# Patient Record
Sex: Male | Born: 1960 | Race: White | Hispanic: No | Marital: Married | Smoking: Former smoker
Health system: Southern US, Community
[De-identification: ages and names within clinical notes are randomized; demographics above are authoritative.]

## PROBLEM LIST (undated history)

## (undated) DIAGNOSIS — E785 Hyperlipidemia, unspecified: Secondary | ICD-10-CM

## (undated) DIAGNOSIS — Z789 Other specified health status: Secondary | ICD-10-CM

## (undated) DIAGNOSIS — I10 Essential (primary) hypertension: Secondary | ICD-10-CM

## (undated) DIAGNOSIS — M1990 Unspecified osteoarthritis, unspecified site: Secondary | ICD-10-CM

## (undated) DIAGNOSIS — C801 Malignant (primary) neoplasm, unspecified: Secondary | ICD-10-CM

## (undated) DIAGNOSIS — K219 Gastro-esophageal reflux disease without esophagitis: Secondary | ICD-10-CM

## (undated) DIAGNOSIS — Z973 Presence of spectacles and contact lenses: Secondary | ICD-10-CM

## (undated) HISTORY — PX: COLONOSCOPY: SHX174

## (undated) HISTORY — PX: SHOULDER ARTHROSCOPY: SHX128

---

## 1999-08-12 ENCOUNTER — Ambulatory Visit (HOSPITAL_COMMUNITY): Admission: RE | Admit: 1999-08-12 | Discharge: 1999-08-12 | Payer: Self-pay | Admitting: Orthopedic Surgery

## 1999-08-12 ENCOUNTER — Encounter: Payer: Self-pay | Admitting: Orthopedic Surgery

## 1999-08-26 ENCOUNTER — Ambulatory Visit (HOSPITAL_COMMUNITY): Admission: RE | Admit: 1999-08-26 | Discharge: 1999-08-26 | Payer: Self-pay | Admitting: Orthopedic Surgery

## 1999-08-26 ENCOUNTER — Encounter: Payer: Self-pay | Admitting: Orthopedic Surgery

## 1999-09-09 ENCOUNTER — Encounter: Payer: Self-pay | Admitting: Orthopedic Surgery

## 1999-09-09 ENCOUNTER — Ambulatory Visit (HOSPITAL_COMMUNITY): Admission: RE | Admit: 1999-09-09 | Discharge: 1999-09-09 | Payer: Self-pay | Admitting: Orthopedic Surgery

## 2000-07-13 HISTORY — PX: INGUINAL HERNIA REPAIR: SHX194

## 2000-10-19 ENCOUNTER — Encounter (HOSPITAL_BASED_OUTPATIENT_CLINIC_OR_DEPARTMENT_OTHER): Payer: Self-pay | Admitting: General Surgery

## 2000-10-20 ENCOUNTER — Encounter (INDEPENDENT_AMBULATORY_CARE_PROVIDER_SITE_OTHER): Payer: Self-pay | Admitting: *Deleted

## 2000-10-20 ENCOUNTER — Ambulatory Visit (HOSPITAL_COMMUNITY): Admission: RE | Admit: 2000-10-20 | Discharge: 2000-10-20 | Payer: Self-pay | Admitting: General Surgery

## 2002-05-19 ENCOUNTER — Ambulatory Visit (HOSPITAL_COMMUNITY): Admission: RE | Admit: 2002-05-19 | Discharge: 2002-05-19 | Payer: Self-pay | Admitting: Gastroenterology

## 2002-05-19 ENCOUNTER — Encounter: Payer: Self-pay | Admitting: Gastroenterology

## 2004-07-13 HISTORY — PX: CERVICAL FUSION: SHX112

## 2005-01-15 ENCOUNTER — Ambulatory Visit (HOSPITAL_COMMUNITY): Admission: RE | Admit: 2005-01-15 | Discharge: 2005-01-16 | Payer: Self-pay | Admitting: Orthopaedic Surgery

## 2008-07-13 HISTORY — PX: SPERMATOCELECTOMY: SHX2420

## 2008-07-13 HISTORY — PX: SHOULDER ARTHROSCOPY W/ ROTATOR CUFF REPAIR: SHX2400

## 2008-10-19 ENCOUNTER — Ambulatory Visit (HOSPITAL_BASED_OUTPATIENT_CLINIC_OR_DEPARTMENT_OTHER): Admission: RE | Admit: 2008-10-19 | Discharge: 2008-10-19 | Payer: Self-pay | Admitting: Urology

## 2008-10-19 ENCOUNTER — Encounter (INDEPENDENT_AMBULATORY_CARE_PROVIDER_SITE_OTHER): Payer: Self-pay | Admitting: Urology

## 2010-10-22 LAB — POCT HEMOGLOBIN-HEMACUE: Hemoglobin: 16.2 g/dL (ref 13.0–17.0)

## 2010-11-25 NOTE — Op Note (Signed)
NAME:  John Benton, John Benton NO.:  [id number]   MEDICAL RECORD NO.:  [id number]          PATIENT TYPE:  AMB   LOCATION:  [facility]                         FACILITY:  [facility]   PHYSICIAN:  Mark C. Vernie Ammons, M.D.  DATE OF BIRTH:  29-Jul-1960   DATE OF PROCEDURE:  10/19/2008  DATE OF DISCHARGE:                               OPERATIVE REPORT   PREOPERATIVE DIAGNOSIS:  Right spermatocele.   POSTOPERATIVE DIAGNOSIS:  Right epididymal lesion.   PROCEDURE:  1. Scrotal exploration.  2. Excision of right epididymal lesion.   SURGEON:  Dr. Vernie Ammons.   ANESTHESIA:  General with local supplement.   SPECIMENS:  Right epididymal lesion to pathology.   BLOOD LOSS:  Less than 1 mL.   DRAINS:  None.   COMPLICATIONS:  None.   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  This resorted in progressive worsening of pain in the right  testicular region and swelling in the area the epididymis.  It was felt  to be a spermatocele, and we therefore discussed surgical excision with  the understanding that this may not result in resolution of his pain.  He understands this and has elected to proceed with surgery.   DESCRIPTION OF OPERATION:  After informed consent, the patient was  brought to the major OR, placed on the table and administered general  anesthesia.  His genitalia was then sterilely prepped and draped, and an  official time out was then performed.   A median raphe scrotal incision was then made and carried down over the  right testicle.  The parietal tunica vaginalis was then opened and  drained of several mL of clear amber fluid.  I then opened this larger  and delivered the testicle.  The testicle itself appeared entirely  normal, and then an appendix testis was noted and fulgurated.  I then  turned my attention to the epididymis and noted there was an area of  scarring where it appears the vas had become adherent to the midportion  of the epididymis, and this was associated with a hard nodule.  I  therefore began incising the tissue overlying this and was able to free  up the area surrounding the hard nodule.  I noted the vas was involved  in this, and I isolated the vas and ligated it distally with a 0 silk  suture.  I followed that the vas into the area of the scar and found he  had adherent to the convoluted portion of the vas/epididymis.  I excised  this from the surrounding tissue and vas and fulgurated any bleeding  points.  I ligated the proximal aspect of which appeared to be  convoluted in its anatomy.  Once this was completely excised, it was  sent to pathology.  Further examination of the epididymis revealed no  further abnormality.  I therefore replaced the right testicle in its  normal anatomic position back within the parietal tunica vaginalis and  closed the  parietal tunica vaginalis over the testicle with a running  locking 3-0 Vicryl suture.  I injected 0.50% plain Marcaine in the  subcutaneous tissue of the incision and then closed the incision with a  running 3-0 chromic suture.  Neosporin, a sterile gauze dressing and  then fluffed Kerlix with a scrotal support were then applied, and the  patient was awakened and taken to recovery room in stable and  satisfactory condition.  He tolerated the procedure well, and there were  no intraoperative complications.   He will be given a prescription for Tylox #36 and remain on Keflex 500  mg b.i.d. for 5 days and return to my office in 4 weeks.   My impression is that it appeared this area was mainly scar tissue or  possibly a sperm granuloma, and it most likely occurred from the injury  that occurred at work and not likely from the vasectomy as the vasectomy  would have been well distal to this area on up more much more superiorly  along the cord.  This was located right at the level  of the epididymis,  and I suspect he probably had epididymal trauma with some leakage of  sperm and the formation of a sperm granuloma although the final  pathology will illuminate the exact etiology of this further.      Mark C. Vernie Ammons, M.D.  Electronically Signed     MCO/MEDQ  D:  10/19/2008  T:  10/19/2008  Job:  [id number]

## 2010-11-28 NOTE — Op Note (Signed)
NAME:  John Benton, John Benton NO.:  [id number]   MEDICAL RECORD NO.:  [id number]          PATIENT TYPE:  OIB   LOCATION:  5033                         FACILITY:  [facility]   PHYSICIAN:  Sharolyn Douglas, M.D.        DATE OF BIRTH:  April 30, 1961   DATE OF PROCEDURE:  01/15/2005  DATE OF DISCHARGE:                                 OPERATIVE REPORT   DIAGNOSIS:  Herniated nucleus pulposus, C6-7, with left upper extremity  radiculopathy.   PROCEDURE:  1.  Anterior cervical diskectomy, C6-7.  2.  Anterior cervical arthrodesis, C6-7, and placement of allograft      prosthesis spacer packed with local autogenous bone graft.  3.  Anterior cervical plating, C6-7. using the Abbott spine system.   SURGEON:  Sharolyn Douglas, MD   ASSISTANT:  Verlin Fester, PA   ANESTHESIA:  General endotracheal.   COMPLICATIONS:  None.   ESTIMATED BLOOD LOSS:  Minimal.   INDICATIONS:  The patient is a 50 year old male with severe left upper  extremity radiculopathy thought to be secondary to C6-7 disk herniation with  foraminal extension.  He has failed conservative treatment and has elected  to undergo ACDF at C6-7 in hopes of improving his symptoms.  Risks and  benefits were reviewed including adjacent segment degeneration as well as  the possible need for additional surgery.   PROCEDURE:  The patient was identified in the holding area and taken to the  operating room, underwent general endotracheal anesthesia without  difficulty, given prophylactic IV antibiotics.  Carefully positioned on the  operating room table with Mayfield head rest, neck in slight extension.  Neck was prepped and draped in usual sterile fashion.  a small transverse  skin incision was made on the left side below the cricoid cartilage.  Dissection was carried sharply through the platysma.  The interval between  the SCM and strap muscles medially was developed down to the prevertebral  space.  The esophagus, trachea and carotid sheath  were identified and  protected at all times.  The C6 disk space was identified and a spinal  needle was placed into the C5-6 disk in order to allow intraoperative  imaging, avoiding the patient's shoulders.  The intraoperative x-ray  revealed the needle to be placed at C5-6 and then we worked down, exposing  the C6-7 disk space by elevating the longus coli muscle bilaterally.  Deep  Shadow-Line retractor placed.  Caspar distraction pins placed into the C6  and C7 vertebral bodies.  The microscope was draped and brought into the  field.  Radical diskectomy carried back to the posterior longitudinal  ligament.  The cartilaginous endplates were scraped clean.  A high-speed bur  was used to take down the posterior vertebral margins and complete  foraminotomies bilaterally.  The posterior longitudinal ligament was taken  down from the midline out towards the left foramen.  A moderate-sized disk  herniation was then removed from the left foramen.  Bleeding was controlled.  The foramina were explored with a blunt probe and felt to be patent.  An 8-  mm  allograft prosthesis spacer was then packed with local bone graft  obtained from the drill shavings.  The prosthesis spacer was inserted into  the interspace and countersunk 1 mm.  We then placed a 24-mm anterior  cervical plate with four 13-mm screws.  We had excellent screw purchase and  ensured that the locking mechanism engaged.  The wound was irrigated.  Deep  Penrose drain was left in place.  The esophagus, trachea and carotid sheath  were examined and no apparent injuries.  Platysma was closed with an  interrupted 2-0 Vicryl, subcutaneous layer closed with a running 3-0 Vicryl  followed by a 4-0 subcuticular Vicryl suture on the skin edges, benzoin and  Steri-Strips placed, sterile dressing applied.  The patient was extubated  without difficulty and transferred to Recovery in stable condition, able to  move his upper and lower  extremities.       MC/MEDQ  D:  01/16/2005  T:  01/16/2005  Job:  [id number]

## 2010-11-28 NOTE — Op Note (Signed)
. [facility]  Patient:    John Benton, John Benton                        MRN: [id number] Proc. Date: 10/20/00 Adm. Date:  [id number] Attending:  Sonda Primes                           Operative Report  PREOPERATIVE DIAGNOSIS:  Left inguinal hernia.  POSTOPERATIVE DIAGNOSIS:  Left inguinal hernia.  PROCEDURE:  Left inguinal hernia repair with mesh.  SURGEON:  Mardene Celeste. Lurene Shadow, M.D.  ASSISTANT:  Nurse.  ANESTHESIA:  MAC, 1% Xylocaine with epinephrine.  CLINICAL NOTE:  The patient is a 50 year old man presenting with a left-sided groin bulge which has become recently symptomatic.  He is brought now to the operating room for repair of his left-sided inguinal hernia.  DESCRIPTION OF PROCEDURE:  With the patient positioned supinely and after the induction of satisfactory sedation, the left groin was prepped and draped to be included in the sterile operative field, infiltrating the lower abdominal crease with 1% Xylocaine with epinephrine, making an incision in the lower abdominal crease, taking this through skin and subcutaneous tissue down to the external oblique aponeurosis.  External oblique is opened up through the external inguinal ring with retraction of the ilioinguinal nerve superiorly and medially.  The spermatic cord is elevated and additional injections of 1% Xylocaine used to maintain anesthesia.  Dissecting along the spermatic cord on the anteromedial aspect, a hernia sac is encountered and with intra-abdominal fat comprising one wall of the sac, making this into a sliding hernia.  The fat was dissected free from the wall of the sac and reduced into the peritoneal cavity, and the sac was then closed with a pursestring suture of 2-0 silk.  The floor of the canal was then repaired after the redundant sac was amputated and forwarded for pathologic evaluation.  I used a Prolene mesh suture to the pubic tubercle and carried up with a  running 2-0 Novofil along the conjoined tendon to the internal ring, and again from pubic tubercle along the shelving edge of Pouparts ligament up to the internal ring.  The mesh is split so as to allow for protrusion of the cord though the mesh.  The tails of the mesh were then trimmed and sutured into the internal oblique muscles.  All areas of dissection then checked for hemostasis, spermatic cord returned to its normal anatomic position.  External oblique aponeurosis closed over the cord with a running suture of 2-0 Vicryl, subcutaneous tissues and Scarpas fascia closed with a running 3-0 Vicryl, skin closed with a running 4-0 Monocryl and then reinforced with Steri-Strips.  Sterile dressings applied. Anesthetic reversed.  Patient removed from the operating room to the recovery room in stable condition.  He tolerated the procedure well. DD:  10/20/00 TD:  10/20/00 Job: [id number]

## 2012-01-26 ENCOUNTER — Encounter (HOSPITAL_BASED_OUTPATIENT_CLINIC_OR_DEPARTMENT_OTHER): Payer: Self-pay | Admitting: *Deleted

## 2012-01-26 NOTE — Progress Notes (Signed)
No cardiac or resp problems 

## 2012-02-01 ENCOUNTER — Other Ambulatory Visit: Payer: Self-pay | Admitting: Orthopedic Surgery

## 2012-02-02 ENCOUNTER — Encounter (HOSPITAL_BASED_OUTPATIENT_CLINIC_OR_DEPARTMENT_OTHER): Payer: Self-pay | Admitting: Orthopedic Surgery

## 2012-02-02 ENCOUNTER — Encounter (HOSPITAL_BASED_OUTPATIENT_CLINIC_OR_DEPARTMENT_OTHER): Payer: Self-pay | Admitting: Anesthesiology

## 2012-02-02 ENCOUNTER — Ambulatory Visit (HOSPITAL_BASED_OUTPATIENT_CLINIC_OR_DEPARTMENT_OTHER)
Admission: RE | Admit: 2012-02-02 | Discharge: 2012-02-02 | Disposition: A | Discharge status: Home / Self Care | Payer: BC Managed Care – PPO | Source: Ambulatory Visit | Attending: Orthopedic Surgery | Admitting: Orthopedic Surgery

## 2012-02-02 ENCOUNTER — Encounter (HOSPITAL_BASED_OUTPATIENT_CLINIC_OR_DEPARTMENT_OTHER)
Admission: RE | Disposition: A | Discharge status: Home / Self Care | Payer: Self-pay | Source: Ambulatory Visit | Attending: Orthopedic Surgery

## 2012-02-02 ENCOUNTER — Ambulatory Visit (HOSPITAL_BASED_OUTPATIENT_CLINIC_OR_DEPARTMENT_OTHER): Payer: BC Managed Care – PPO | Admitting: Anesthesiology

## 2012-02-02 DIAGNOSIS — M720 Palmar fascial fibromatosis [Dupuytren]: Secondary | ICD-10-CM | POA: Insufficient documentation

## 2012-02-02 HISTORY — DX: Other specified health status: Z78.9

## 2012-02-02 HISTORY — DX: Hyperlipidemia, unspecified: E78.5

## 2012-02-02 HISTORY — DX: Unspecified osteoarthritis, unspecified site: M19.90

## 2012-02-02 HISTORY — PX: DUPUYTREN CONTRACTURE RELEASE: SHX1478

## 2012-02-02 HISTORY — DX: Gastro-esophageal reflux disease without esophagitis: K21.9

## 2012-02-02 LAB — POCT HEMOGLOBIN-HEMACUE: Hemoglobin: 16.2 g/dL (ref 13.0–17.0)

## 2012-02-02 SURGERY — RELEASE, DUPUYTREN CONTRACTURE
Anesthesia: Choice | Site: Hand | Laterality: Right | Wound class: Clean

## 2012-02-02 MED ORDER — THROMBIN 20000 UNITS EX KIT
PACK | CUTANEOUS | Status: DC | PRN
  Administered 2012-02-02: 20000 [IU] via TOPICAL

## 2012-02-02 MED ORDER — DEXAMETHASONE SODIUM PHOSPHATE 4 MG/ML IJ SOLN
INTRAMUSCULAR | Status: DC | PRN
  Administered 2012-02-02: 4 mg
  Administered 2012-02-02: 10 mg via INTRAVENOUS

## 2012-02-02 MED ORDER — MIDAZOLAM HCL 2 MG/2ML IJ SOLN: 1.0000 mg | INTRAMUSCULAR | Status: DC | PRN

## 2012-02-02 MED ORDER — VANCOMYCIN HCL 1000 MG IV SOLR
1000.0000 mg | INTRAVENOUS | Status: DC | PRN
  Administered 2012-02-02: 1000 mg via INTRAVENOUS

## 2012-02-02 MED ORDER — HYDROCODONE-ACETAMINOPHEN 5-500 MG PO TABS
1.0000 | ORAL_TABLET | ORAL | Status: AC | PRN
Start: 2012-02-02 — End: 2012-02-12

## 2012-02-02 MED ORDER — HYDROCODONE-ACETAMINOPHEN 5-325 MG PO TABS
1.0000 | ORAL_TABLET | Freq: Once | ORAL | Status: AC
  Administered 2012-02-02: 1 via ORAL

## 2012-02-02 MED ORDER — PROPOFOL 10 MG/ML IV BOLUS
INTRAVENOUS | Status: DC | PRN
  Administered 2012-02-02: 230 mg via INTRAVENOUS

## 2012-02-02 MED ORDER — ONDANSETRON HCL 4 MG/2ML IJ SOLN
INTRAMUSCULAR | Status: DC | PRN
  Administered 2012-02-02: 4 mg via INTRAVENOUS

## 2012-02-02 MED ORDER — FENTANYL CITRATE 0.05 MG/ML IJ SOLN
INTRAMUSCULAR | Status: DC | PRN
  Administered 2012-02-02 (×3): 25 ug via INTRAVENOUS

## 2012-02-02 MED ORDER — FENTANYL CITRATE 0.05 MG/ML IJ SOLN: 50.0000 ug | INTRAMUSCULAR | Status: DC | PRN

## 2012-02-02 MED ORDER — LIDOCAINE HCL (CARDIAC) 20 MG/ML IV SOLN
INTRAVENOUS | Status: DC | PRN
  Administered 2012-02-02: 100 mg via INTRAVENOUS

## 2012-02-02 MED ORDER — CHLORHEXIDINE GLUCONATE 4 % EX LIQD: 60.0000 mL | Freq: Once | CUTANEOUS | Status: DC

## 2012-02-02 MED ORDER — BUPIVACAINE HCL (PF) 0.25 % IJ SOLN
INTRAMUSCULAR | Status: DC | PRN
  Administered 2012-02-02: 7 mL

## 2012-02-02 MED ORDER — LORAZEPAM 2 MG/ML IJ SOLN: 1.0000 mg | Freq: Once | INTRAMUSCULAR | Status: DC | PRN

## 2012-02-02 MED ORDER — BUPIVACAINE-EPINEPHRINE PF 0.5-1:200000 % IJ SOLN
INTRAMUSCULAR | Status: DC | PRN
  Administered 2012-02-02: 25 mL

## 2012-02-02 MED ORDER — MIDAZOLAM HCL 2 MG/2ML IJ SOLN
1.0000 mg | INTRAMUSCULAR | Status: DC | PRN
  Administered 2012-02-02 (×2): 2 mg via INTRAVENOUS

## 2012-02-02 MED ORDER — FENTANYL CITRATE 0.05 MG/ML IJ SOLN
50.0000 ug | INTRAMUSCULAR | Status: DC | PRN
  Administered 2012-02-02 (×2): 100 ug via INTRAVENOUS

## 2012-02-02 MED ORDER — LACTATED RINGERS IV SOLN
INTRAVENOUS | Status: DC
  Administered 2012-02-02 (×2): via INTRAVENOUS

## 2012-02-02 MED ORDER — HYDROMORPHONE HCL PF 1 MG/ML IJ SOLN
0.2500 mg | INTRAMUSCULAR | Status: DC | PRN
  Administered 2012-02-02: 0.5 mg via INTRAVENOUS

## 2012-02-02 SURGICAL SUPPLY — 42 items
BANDAGE GAUZE ELAST BULKY 4 IN (GAUZE/BANDAGES/DRESSINGS) ×2 IMPLANT
BLADE MINI RND TIP GREEN BEAV (BLADE) IMPLANT
BLADE SURG 15 STRL LF DISP TIS (BLADE) ×1 IMPLANT
BLADE SURG 15 STRL SS (BLADE) ×1
BNDG COHESIVE 3X5 TAN STRL LF (GAUZE/BANDAGES/DRESSINGS) ×2 IMPLANT
BNDG ESMARK 4X9 LF (GAUZE/BANDAGES/DRESSINGS) ×2 IMPLANT
CHLORAPREP W/TINT 26ML (MISCELLANEOUS) ×2 IMPLANT
CLOTH BEACON ORANGE TIMEOUT ST (SAFETY) ×2 IMPLANT
CORDS BIPOLAR (ELECTRODE) ×2 IMPLANT
COVER MAYO STAND STRL (DRAPES) ×2 IMPLANT
COVER TABLE BACK 60X90 (DRAPES) ×2 IMPLANT
CUFF TOURNIQUET SINGLE 18IN (TOURNIQUET CUFF) IMPLANT
DECANTER SPIKE VIAL GLASS SM (MISCELLANEOUS) IMPLANT
DRAPE EXTREMITY T 121X128X90 (DRAPE) ×2 IMPLANT
DRAPE SURG 17X23 STRL (DRAPES) ×2 IMPLANT
DRSG KUZMA FLUFF (GAUZE/BANDAGES/DRESSINGS) ×2 IMPLANT
GAUZE XEROFORM 1X8 LF (GAUZE/BANDAGES/DRESSINGS) ×2 IMPLANT
GLOVE BIO SURGEON STRL SZ 6.5 (GLOVE) ×2 IMPLANT
GLOVE SURG ORTHO 8.0 STRL STRW (GLOVE) ×2 IMPLANT
GOWN BRE IMP PREV XXLGXLNG (GOWN DISPOSABLE) ×4 IMPLANT
GOWN PREVENTION PLUS XLARGE (GOWN DISPOSABLE) ×2 IMPLANT
LOOP VESSEL MAXI BLUE (MISCELLANEOUS) ×2 IMPLANT
NS IRRIG 1000ML POUR BTL (IV SOLUTION) ×2 IMPLANT
PACK BASIN DAY SURGERY FS (CUSTOM PROCEDURE TRAY) ×2 IMPLANT
PAD CAST 3X4 CTTN HI CHSV (CAST SUPPLIES) ×1 IMPLANT
PADDING CAST ABS 3INX4YD NS (CAST SUPPLIES) ×1
PADDING CAST ABS 4INX4YD NS (CAST SUPPLIES) ×1
PADDING CAST ABS COTTON 3X4 (CAST SUPPLIES) ×1 IMPLANT
PADDING CAST ABS COTTON 4X4 ST (CAST SUPPLIES) ×1 IMPLANT
PADDING CAST COTTON 3X4 STRL (CAST SUPPLIES) ×1
SLEEVE SCD COMPRESS KNEE MED (MISCELLANEOUS) ×2 IMPLANT
SPLINT PLASTER CAST XFAST 3X15 (CAST SUPPLIES) ×10 IMPLANT
SPLINT PLASTER XTRA FASTSET 3X (CAST SUPPLIES) ×10
SPONGE GAUZE 4X4 12PLY (GAUZE/BANDAGES/DRESSINGS) ×2 IMPLANT
STOCKINETTE 4X48 STRL (DRAPES) ×2 IMPLANT
SUT SILK 2 0 FS (SUTURE) ×2 IMPLANT
SUT VICRYL RAPIDE 4/0 PS 2 (SUTURE) ×4 IMPLANT
SYR BULB 3OZ (MISCELLANEOUS) ×2 IMPLANT
SYR CONTROL 10ML LL (SYRINGE) ×2 IMPLANT
TOWEL OR 17X24 6PK STRL BLUE (TOWEL DISPOSABLE) ×2 IMPLANT
UNDERPAD 30X30 INCONTINENT (UNDERPADS AND DIAPERS) ×2 IMPLANT
WATER STERILE IRR 1000ML POUR (IV SOLUTION) IMPLANT

## 2012-02-02 NOTE — Progress Notes (Signed)
Assisted Dr. Kasik with right, ultrasound guided, supraclavicular block. Side rails up, monitors on throughout procedure. See vital signs in flow sheet. Tolerated Procedure well. 

## 2012-02-02 NOTE — Anesthesia Postprocedure Evaluation (Signed)
  Anesthesia Post-op Note  Patient: John Benton  Procedure(s) Performed: Procedure(s) (LRB): DUPUYTREN CONTRACTURE RELEASE (Right)  Patient Location: PACU  Anesthesia Type: GA combined with regional for post-op pain  Level of Consciousness: awake  Airway and Oxygen Therapy: Patient Spontanous Breathing  Post-op Pain: mild  Post-op Assessment: Post-op Vital signs reviewed, Patient's Cardiovascular Status Stable, Respiratory Function Stable, Patent Airway, No signs of Nausea or vomiting and Pain level controlled  Post-op Vital Signs: stable  Complications: No apparent anesthesia complications

## 2012-02-02 NOTE — H&P (Signed)
  John Benton is 66l years old.John Benton is seen for evaluation of his Dupuytren's contracture right middle and little finger. This has pulled the joint down into approximately 30 degrees flexion with 10 degree flexion deformity to the PIP joint. He desires to have this treated surgically.  He has been taking ibuprofen.   Past Medical History: He is allergic to PCN. He is on Simvastatin, Niacin, Omeprazole, fish oil and multivitamins and calcium. He has had C-spine, hernia repair, shoulder surgery times 2.   Family Medical History: Positive for high BP and arthritis.  Social History: He does not smoke. He drinks socially. He is married and a Merchandiser, retail for [employer].  Review of Systems: Positive for cataracts, otherwise negative.He desires to have this treated surgically John Benton is an 51 y.o. male.   Chief Complaint: Dupuytren's contracture rt HPI: see above  Past Medical History  Diagnosis Date  . No pertinent past medical history   . Hyperlipemia   . Arthritis   . GERD (gastroesophageal reflux disease)     Past Surgical History  Procedure Date  . Shoulder arthroscopy w/ rotator cuff repair 2010    left  . Spermatocelectomy 2010  . Inguinal hernia repair 2002    lt  . Cervical fusion 2006  . Colonoscopy     No family history on file. Social History:  reports that he has quit smoking. He quit smokeless tobacco use about 23 years ago. He reports that he drinks alcohol. He reports that he does not use illicit drugs.  Allergies:  Allergies  Allergen Reactions  . Penicillins Anaphylaxis    Medications Prior to Admission  Medication Sig Dispense Refill  . aspirin 81 MG tablet Take 81 mg by mouth daily.      Marland Kitchen co-enzyme Q-10 30 MG capsule Take 30 mg by mouth 3 (three) times daily.      . Multiple Vitamins-Minerals (MULTIVITAMIN WITH MINERALS) tablet Take 1 tablet by mouth daily.      Marland Kitchen omeprazole (PRILOSEC) 20 MG capsule Take 20 mg by mouth daily.      . simvastatin (ZOCOR)  40 MG tablet Take 40 mg by mouth at bedtime.        No results found for this or any previous visit (from the past 48 hour(s)).  No results found.   Pertinent items are noted in HPI.  Height 5\' 10"  (1.778 m), weight 99.791 kg (220 lb).  General appearance: alert Head: Normocephalic, without obvious abnormality Neck: no adenopathy Resp: clear to auscultation bilaterally Cardio: regular rate and rhythm, S1, S2 normal, no murmur, click, rub or gallop GI: soft, non-tender; bowel sounds normal; no masses,  no organomegaly Extremities: extremities normal, atraumatic, no cyanosis or edema Pulses: 2+ and symmetric Skin: Skin color, texture, turgor normal. No rashes or lesions Neurologic: Grossly normal Incision/Wound: na  Assessment/Plan DX: Dupuytrens contracture  We have discussed Xiaflex, fasciotomy, fasciectomy, risks and complications including infection, recurrence, injury to arteries, nerves and incomplete relief of symptoms and dystrophy. These are all discussed. He would like to proceed with fasciectomy of the right little finger and middle finger. This will be scheduled as an outpatient.   Kengo Sturges R 02/02/2012, 7:43 AM

## 2012-02-02 NOTE — Op Note (Signed)
NAME:  John Benton, John Benton NO.:  [id number]  MEDICAL RECORD NO.:  [id number]  LOCATION:                                 FACILITY:  PHYSICIAN:  Cindee Salt, M.D.            DATE OF BIRTH:  DATE OF PROCEDURE:  02/02/2012 DATE OF DISCHARGE:                              OPERATIVE REPORT   PREOPERATIVE DIAGNOSIS:  Dupuytren contracture, right little finger, right ring, right middle.  POSTOPERATIVE DIAGNOSIS:  Dupuytren contracture, right little finger, right ring, right middle.  OPERATION:  Excision of palmar fascia, right little with V-Y advancements, incision of palm, fascia to the middle ring fingers.  SURGEON:  Cindee Salt, MD  ASSISTANT:  Betha Loa, MD  ANESTHESIA:  Axillary block, general, and wrist block.  ANESTHESIOLOGIST:  Bedelia Person, MD  HISTORY:  The patient is a 51 year old male with a history of Dupuytren contracture to the little finger.  He has extensions to the middle and ring.  He is desirous of excision with fasciectomy.  Pre, peri, and postoperative course have been discussed along with risks and complications.  He is aware there is no guarantee with the surgery, possibility of infection, recurrence of injury to arteries, nerves, and tendons, incomplete relief of symptoms, and dystrophy.  In the preoperative area, the patient is seen, the extremity is marked by both the patient and surgeon, and antibiotic is given.  PROCEDURE:  The patient was brought to the operating room where an axillary block was carried out without difficulty by Dr. Gypsy Balsam.  General anesthetic was given.  He was prepped using ChloraPrep, supine position, right arm free.  A 3-minute dry time was allowed.  A time-out was taken confirming the patient and procedure.  A volar zigzag incision Loletha Carrow type was made over the little finger and carried down through subcutaneous tissue.  Bleeders were electrocauterized with bipolar.  The palmar fascia was identified at the proximal  aspect of the flexor retinaculum.  This was released across the entire aspect.  This was then divided identifying neurovascular bundles to the middle ring and little fingers with blunt and sharp dissection.  Palmar fascia was excised taking care to protect neurovascular bundles to the middle phalanx of the little finger.  Spiral nerve was present on the ulnar aspect of the PIP joint area to the little finger.  Both neurovascular bundles were identified and protected.  The extensions to the mid middle and ring fingers were also excised .  These were done to the level of the A2 pulleys on each finger through palm only.  This was done through a separate incision over the metacarpal of the middle finger.  These were then converted to wise.  The wound was copiously irrigated with saline. A thrombin spray was then sprayed into each one of the wounds.  A doubled-over vessel loop drain was placed to the depths of each incision and carried out proximally.  The wound was then closed with interrupted 4-0 Vicryl Rapide sutures.  A compressive dressing was applied.  The tourniquet was deflated.  All fingers immediately pinked.  A dorsal splint was applied. The patient tolerated the procedure  well and was taken to the recovery room for observation in satisfactory condition.          ______________________________ Cindee Salt, M.D.     GK/MEDQ  D:  02/02/2012  T:  02/02/2012  Job:  [id number]

## 2012-02-02 NOTE — Op Note (Signed)
Dictated number: [id number]

## 2012-02-02 NOTE — Anesthesia Preprocedure Evaluation (Signed)
Anesthesia Evaluation  Patient identified by MRN, date of birth, ID band Patient awake    Reviewed: Allergy & Precautions, H&P , NPO status , Patient's Chart, lab work & pertinent test results  Airway Mallampati: II TM Distance: >3 FB Neck ROM: Full    Dental   Pulmonary    Pulmonary exam normal       Cardiovascular     Neuro/Psych    GI/Hepatic GERD-  ,  Endo/Other    Renal/GU      Musculoskeletal   Abdominal (+) + obese,   Peds  Hematology   Anesthesia Other Findings   Reproductive/Obstetrics                           Anesthesia Physical Anesthesia Plan  ASA: II  Anesthesia Plan: General   Post-op Pain Management:    Induction: Intravenous  Airway Management Planned: LMA  Additional Equipment:   Intra-op Plan:   Post-operative Plan: Extubation in OR  Informed Consent: I have reviewed the patients History and Physical, chart, labs and discussed the procedure including the risks, benefits and alternatives for the proposed anesthesia with the patient or authorized representative who has indicated his/her understanding and acceptance.     Plan Discussed with: CRNA and Surgeon  Anesthesia Plan Comments:         Anesthesia Quick Evaluation

## 2012-02-02 NOTE — Anesthesia Procedure Notes (Signed)
Anesthesia Regional Block:  Supraclavicular block  Pre-Anesthetic Checklist: ,, timeout performed, Correct Patient, Correct Site, Correct Laterality, Correct Procedure, Correct Position, site marked, Risks and benefits discussed,  Surgical consent,  Pre-op evaluation,  At surgeon's request and post-op pain management  Laterality: Right  Prep: chloraprep       Needles:  Injection technique: Single-shot  Needle Type: Echogenic Stimulator Needle     Needle Length: 5cm 5 cm Needle Gauge: 22 and 22 G    Additional Needles:  Procedures: ultrasound guided and nerve stimulator Supraclavicular block  Nerve Stimulator or Paresthesia:  Response: 0.5 mA,   Additional Responses:   Narrative:  Start time: 02/02/2012 8:44 AM End time: 02/02/2012 8:58 AM Injection made incrementally with aspirations every 3 mL. Anesthesiologist: Dr Gypsy Balsam  Additional Notes: [id number] POP R [place] block CHG prep, sterile tech US guidance-pix in chart Marc .5% w/epi+decsdron 4mg  infiltrated No air, art blood No compl Stim down to .5ma Dr Gypsy Balsam

## 2012-02-02 NOTE — Brief Op Note (Signed)
02/02/2012  10:48 AM  PATIENT:  Scarlette Ar  51 y.o. male  PRE-OPERATIVE DIAGNOSIS:  dupuytrens right little and right middle fingers and right ring finger  POST-OPERATIVE DIAGNOSIS:  dupuytrens right little and right middle fingers and right ring finger  PROCEDURE:  Procedure(s) (LRB): DUPUYTREN CONTRACTURE RELEASE (Right)  SURGEON:  Surgeon(s) and Role:    * Nicki Reaper, MD - Primary    * Tami Ribas, MD - Assisting  PHYSICIAN ASSISTANT:   ASSISTANTS: K Sumire Halbleib,MD   ANESTHESIA:   local, regional and general  EBL:  Total I/O In: 300 [I.V.:300] Out: -   BLOOD ADMINISTERED:none  DRAINS: Penrose drain in the palm and little   LOCAL MEDICATIONS USED:  MARCAINE     SPECIMEN:  Excision  DISPOSITION OF SPECIMEN:  PATHOLOGY  COUNTS:  YES  TOURNIQUET:   Total Tourniquet Time Documented: Upper Arm (Right) - 55 minutes  DICTATION: .Other Dictation: Dictation Number [id number]  PLAN OF CARE: Discharge to home after PACU  PATIENT DISPOSITION:  PACU - hemodynamically stable.

## 2012-02-02 NOTE — Transfer of Care (Signed)
Immediate Anesthesia Transfer of Care Note  Patient: John Benton  Procedure(s) Performed: Procedure(s) (LRB): DUPUYTREN CONTRACTURE RELEASE (Right)  Patient Location: PACU  Anesthesia Type: GA combined with regional for post-op pain  Level of Consciousness: awake and patient cooperative  Airway & Oxygen Therapy: Patient Spontanous Breathing and Patient connected to face mask oxygen  Post-op Assessment: Report given to PACU RN  Post vital signs: Reviewed and stable  Complications: No apparent anesthesia complications

## 2012-02-03 ENCOUNTER — Encounter (HOSPITAL_BASED_OUTPATIENT_CLINIC_OR_DEPARTMENT_OTHER): Payer: Self-pay | Admitting: Orthopedic Surgery

## 2013-04-12 ENCOUNTER — Other Ambulatory Visit: Payer: Self-pay | Admitting: Orthopedic Surgery

## 2013-04-25 ENCOUNTER — Encounter (INDEPENDENT_AMBULATORY_CARE_PROVIDER_SITE_OTHER): Payer: Self-pay | Admitting: General Surgery

## 2013-04-25 ENCOUNTER — Ambulatory Visit (INDEPENDENT_AMBULATORY_CARE_PROVIDER_SITE_OTHER): Payer: BC Managed Care – PPO | Admitting: General Surgery

## 2013-04-25 VITALS — BP 114/90 | HR 53 | Ht 70.5 in | Wt 223.4 lb

## 2013-04-25 DIAGNOSIS — K429 Umbilical hernia without obstruction or gangrene: Secondary | ICD-10-CM

## 2013-04-25 NOTE — Patient Instructions (Signed)
Plan for umbilical hernia repair with possible mesh

## 2013-04-25 NOTE — Progress Notes (Signed)
Patient ID: John Benton, male   DOB: 11/26/1960, 52 y.o.   MRN: [id number]  Chief Complaint  Patient presents with  . New Evaluation    eval umb hernia    HPI John Benton is a 52 y.o. male.  We are asked to see the patient in consultation by Dr. Foy Guadalajara to evaluate him for an umbilical hernia. The patient is a 52 year old white male who was recently lifting heavy weights at the gym when he felt some discomfort at his bellybutton. Shortly after this he noticed a small bulge at his bellybutton. He is able to reduce it. He does have some discomfort associated with it. He denies any nausea or vomiting. His appetite is good and his bowels are working normally.  HPI  Past Medical History  Diagnosis Date  . No pertinent past medical history   . Hyperlipemia   . Arthritis   . GERD (gastroesophageal reflux disease)     Past Surgical History  Procedure Laterality Date  . Shoulder arthroscopy w/ rotator cuff repair  2010    left  . Spermatocelectomy  2010  . Inguinal hernia repair  2002    lt  . Cervical fusion  2006  . Colonoscopy    . Dupuytren contracture release  02/02/2012    Procedure: DUPUYTREN CONTRACTURE RELEASE;  Surgeon: Nicki Reaper, MD;  Location: [facility];  Service: Orthopedics;  Laterality: Right;  fasciectomy right little, middle, and ring fingers of right hand.    Family History  Problem Relation Age of Onset  . Kidney disease Mother   . Cancer Sister     Breast  . Cancer Maternal Grandmother     Breast    Social History History  Substance Use Topics  . Smoking status: Former Games developer  . Smokeless tobacco: Former Neurosurgeon    Quit date: 01/25/1989  . Alcohol Use: Yes     Comment: daily    Allergies  Allergen Reactions  . Penicillins Anaphylaxis    Current Outpatient Prescriptions  Medication Sig Dispense Refill  . calcium carbonate 200 MG capsule Take 250 mg by mouth 2 (two) times daily with a meal.      . co-enzyme Q-10 30 MG capsule Take 30  mg by mouth 3 (three) times daily.      . fish oil-omega-3 fatty acids 1000 MG capsule Take 2 g by mouth daily.      . Multiple Vitamins-Minerals (MULTIVITAMIN WITH MINERALS) tablet Take 1 tablet by mouth daily.      Marland Kitchen omeprazole (PRILOSEC) 20 MG capsule Take 20 mg by mouth daily.      . simvastatin (ZOCOR) 40 MG tablet Take 40 mg by mouth at bedtime.       No current facility-administered medications for this visit.    Review of Systems Review of Systems  Constitutional: Negative.   HENT: Negative.   Eyes: Negative.   Respiratory: Negative.   Cardiovascular: Negative.   Gastrointestinal: Positive for abdominal pain.  Endocrine: Negative.   Genitourinary: Negative.   Musculoskeletal: Negative.   Skin: Negative.   Allergic/Immunologic: Negative.   Neurological: Negative.   Hematological: Negative.   Psychiatric/Behavioral: Negative.     Blood pressure 114/90, pulse 53, height 5' 10.5" (1.791 m), weight 223 lb 6.4 oz (101.334 kg), SpO2 98.00%.  Physical Exam Physical Exam  Constitutional: He is oriented to person, place, and time. He appears well-developed and well-nourished.  HENT:  Head: Normocephalic and atraumatic.  Eyes: Conjunctivae and EOM are  normal. Pupils are equal, round, and reactive to light.  Neck: Normal range of motion. Neck supple.  Cardiovascular: Normal rate, regular rhythm and normal heart sounds.   Pulmonary/Chest: Effort normal and breath sounds normal.  Abdominal: Soft. Bowel sounds are normal.  There is a small reducible umbilical hernia. There is some tenderness to palpation.  Musculoskeletal: Normal range of motion.  Neurological: He is alert and oriented to person, place, and time.  Skin: Skin is warm and dry.  Psychiatric: He has a normal mood and affect. His behavior is normal.    Data Reviewed As above  Assessment    The patient has a small but symptomatic umbilical hernia. Because of the risk of incarceration strenuousness think he would  benefit from having this fixed. He would also like to have this done. I have discussed with him in detail the risks and benefits of the operation to fix the hernia as well as some of the technical aspects including the use of mesh and he understands and wishes to proceed     Plan    Plan for umbilical hernia repair with mesh. We will need to coordinate with Dr. Merlyn Lot Since he is also scheduled for hand surgery that day and would like to try to do this with one anesthetic.       TOTH III,PAUL S 04/25/2013, 12:20 PM

## 2013-04-28 ENCOUNTER — Encounter (INDEPENDENT_AMBULATORY_CARE_PROVIDER_SITE_OTHER): Payer: Self-pay | Admitting: General Surgery

## 2013-05-11 ENCOUNTER — Encounter (HOSPITAL_BASED_OUTPATIENT_CLINIC_OR_DEPARTMENT_OTHER): Payer: Self-pay | Admitting: *Deleted

## 2013-05-11 NOTE — Progress Notes (Signed)
Pt had rt hand surgery here 2013-he has no cardiac or resp problems Had labs pcp recently

## 2013-05-17 ENCOUNTER — Encounter (HOSPITAL_BASED_OUTPATIENT_CLINIC_OR_DEPARTMENT_OTHER)
Admission: RE | Disposition: A | Discharge status: Home / Self Care | Payer: Self-pay | Source: Ambulatory Visit | Attending: Orthopedic Surgery

## 2013-05-17 ENCOUNTER — Ambulatory Visit (HOSPITAL_BASED_OUTPATIENT_CLINIC_OR_DEPARTMENT_OTHER): Payer: BC Managed Care – PPO | Admitting: Anesthesiology

## 2013-05-17 ENCOUNTER — Encounter (HOSPITAL_BASED_OUTPATIENT_CLINIC_OR_DEPARTMENT_OTHER): Payer: BC Managed Care – PPO | Admitting: Anesthesiology

## 2013-05-17 ENCOUNTER — Ambulatory Visit (HOSPITAL_BASED_OUTPATIENT_CLINIC_OR_DEPARTMENT_OTHER)
Admission: RE | Admit: 2013-05-17 | Discharge: 2013-05-17 | Disposition: A | Discharge status: Home / Self Care | Payer: BC Managed Care – PPO | Source: Ambulatory Visit | Attending: Orthopedic Surgery | Admitting: Orthopedic Surgery

## 2013-05-17 ENCOUNTER — Encounter (HOSPITAL_BASED_OUTPATIENT_CLINIC_OR_DEPARTMENT_OTHER): Payer: Self-pay | Admitting: *Deleted

## 2013-05-17 DIAGNOSIS — Z87891 Personal history of nicotine dependence: Secondary | ICD-10-CM | POA: Insufficient documentation

## 2013-05-17 DIAGNOSIS — M720 Palmar fascial fibromatosis [Dupuytren]: Secondary | ICD-10-CM | POA: Insufficient documentation

## 2013-05-17 DIAGNOSIS — K429 Umbilical hernia without obstruction or gangrene: Secondary | ICD-10-CM | POA: Insufficient documentation

## 2013-05-17 DIAGNOSIS — R030 Elevated blood-pressure reading, without diagnosis of hypertension: Secondary | ICD-10-CM | POA: Insufficient documentation

## 2013-05-17 DIAGNOSIS — E785 Hyperlipidemia, unspecified: Secondary | ICD-10-CM | POA: Insufficient documentation

## 2013-05-17 DIAGNOSIS — K219 Gastro-esophageal reflux disease without esophagitis: Secondary | ICD-10-CM | POA: Insufficient documentation

## 2013-05-17 DIAGNOSIS — Z880 Allergy status to penicillin: Secondary | ICD-10-CM | POA: Insufficient documentation

## 2013-05-17 HISTORY — DX: Presence of spectacles and contact lenses: Z97.3

## 2013-05-17 HISTORY — PX: FASCIOTOMY: SHX132

## 2013-05-17 HISTORY — PX: UMBILICAL HERNIA REPAIR: SHX196

## 2013-05-17 SURGERY — FASCIOTOMY, UPPER EXTREMITY
Anesthesia: General | Site: Hand | Wound class: Clean

## 2013-05-17 MED ORDER — FENTANYL CITRATE 0.05 MG/ML IJ SOLN
INTRAMUSCULAR | Status: AC
  Filled 2013-05-17: qty 6

## 2013-05-17 MED ORDER — HYDROMORPHONE HCL PF 1 MG/ML IJ SOLN
INTRAMUSCULAR | Status: AC
  Filled 2013-05-17: qty 1

## 2013-05-17 MED ORDER — BUPIVACAINE HCL (PF) 0.25 % IJ SOLN
INTRAMUSCULAR | Status: AC
  Filled 2013-05-17: qty 30

## 2013-05-17 MED ORDER — THROMBIN 20000 UNITS EX KIT
PACK | CUTANEOUS | Status: DC | PRN
  Administered 2013-05-17: 4000 [IU] via TOPICAL

## 2013-05-17 MED ORDER — EPHEDRINE SULFATE 50 MG/ML IJ SOLN
INTRAMUSCULAR | Status: DC | PRN
  Administered 2013-05-17: 10 mg via INTRAVENOUS

## 2013-05-17 MED ORDER — CHLORHEXIDINE GLUCONATE 4 % EX LIQD: 1.0000 "application " | Freq: Once | CUTANEOUS | Status: DC

## 2013-05-17 MED ORDER — MIDAZOLAM HCL 2 MG/2ML IJ SOLN: 1.0000 mg | INTRAMUSCULAR | Status: DC | PRN

## 2013-05-17 MED ORDER — HYDROMORPHONE HCL PF 1 MG/ML IJ SOLN
0.2500 mg | INTRAMUSCULAR | Status: DC | PRN
  Administered 2013-05-17: 0.5 mg via INTRAVENOUS

## 2013-05-17 MED ORDER — BUPIVACAINE HCL (PF) 0.25 % IJ SOLN
INTRAMUSCULAR | Status: DC | PRN
  Administered 2013-05-17: 10 mL
  Administered 2013-05-17: 8 mL

## 2013-05-17 MED ORDER — FENTANYL CITRATE 0.05 MG/ML IJ SOLN: 50.0000 ug | INTRAMUSCULAR | Status: DC | PRN

## 2013-05-17 MED ORDER — BUPIVACAINE-EPINEPHRINE PF 0.25-1:200000 % IJ SOLN
INTRAMUSCULAR | Status: AC
  Filled 2013-05-17: qty 30

## 2013-05-17 MED ORDER — CHLORHEXIDINE GLUCONATE 4 % EX LIQD: 60.0000 mL | Freq: Once | CUTANEOUS | Status: DC

## 2013-05-17 MED ORDER — OXYCODONE-ACETAMINOPHEN 10-325 MG PO TABS: 1.0000 | ORAL_TABLET | ORAL | Status: DC | PRN

## 2013-05-17 MED ORDER — VANCOMYCIN HCL IN DEXTROSE 1-5 GM/200ML-% IV SOLN: 1000.0000 mg | INTRAVENOUS | Status: DC

## 2013-05-17 MED ORDER — KETOROLAC TROMETHAMINE 30 MG/ML IJ SOLN
30.0000 mg | Freq: Once | INTRAMUSCULAR | Status: AC
  Administered 2013-05-17: 30 mg via INTRAVENOUS

## 2013-05-17 MED ORDER — LIDOCAINE HCL (CARDIAC) 20 MG/ML IV SOLN
INTRAVENOUS | Status: DC | PRN
  Administered 2013-05-17: 80 mg via INTRAVENOUS

## 2013-05-17 MED ORDER — MIDAZOLAM HCL 2 MG/2ML IJ SOLN
INTRAMUSCULAR | Status: AC
  Filled 2013-05-17: qty 2

## 2013-05-17 MED ORDER — SUCCINYLCHOLINE CHLORIDE 20 MG/ML IJ SOLN
INTRAMUSCULAR | Status: AC
  Filled 2013-05-17: qty 1

## 2013-05-17 MED ORDER — ONDANSETRON HCL 4 MG/2ML IJ SOLN: 4.0000 mg | Freq: Once | INTRAMUSCULAR | Status: DC | PRN

## 2013-05-17 MED ORDER — VANCOMYCIN HCL IN DEXTROSE 1-5 GM/200ML-% IV SOLN
INTRAVENOUS | Status: AC
  Filled 2013-05-17: qty 200

## 2013-05-17 MED ORDER — DEXAMETHASONE SODIUM PHOSPHATE 4 MG/ML IJ SOLN
INTRAMUSCULAR | Status: DC | PRN
  Administered 2013-05-17: 10 mg via INTRAVENOUS

## 2013-05-17 MED ORDER — OXYCODONE HCL 5 MG PO TABS: 5.0000 mg | ORAL_TABLET | Freq: Once | ORAL | Status: DC | PRN

## 2013-05-17 MED ORDER — THROMBIN 20000 UNITS EX KIT
PACK | CUTANEOUS | Status: AC
  Filled 2013-05-17: qty 1

## 2013-05-17 MED ORDER — HYDROMORPHONE HCL PF 1 MG/ML IJ SOLN
0.2500 mg | INTRAMUSCULAR | Status: DC | PRN
  Administered 2013-05-17 (×4): 0.5 mg via INTRAVENOUS

## 2013-05-17 MED ORDER — PROPOFOL 10 MG/ML IV BOLUS
INTRAVENOUS | Status: AC
  Filled 2013-05-17: qty 20

## 2013-05-17 MED ORDER — PROPOFOL 10 MG/ML IV BOLUS
INTRAVENOUS | Status: DC | PRN
  Administered 2013-05-17: 300 mg via INTRAVENOUS

## 2013-05-17 MED ORDER — LACTATED RINGERS IV SOLN
INTRAVENOUS | Status: DC
  Administered 2013-05-17 (×3): via INTRAVENOUS

## 2013-05-17 MED ORDER — VANCOMYCIN HCL IN DEXTROSE 1-5 GM/200ML-% IV SOLN
1000.0000 mg | INTRAVENOUS | Status: AC
  Administered 2013-05-17: 1000 mg via INTRAVENOUS

## 2013-05-17 MED ORDER — FENTANYL CITRATE 0.05 MG/ML IJ SOLN
INTRAMUSCULAR | Status: DC | PRN
  Administered 2013-05-17 (×2): 25 ug via INTRAVENOUS
  Administered 2013-05-17: 100 ug via INTRAVENOUS
  Administered 2013-05-17: 50 ug via INTRAVENOUS

## 2013-05-17 MED ORDER — MIDAZOLAM HCL 5 MG/5ML IJ SOLN
INTRAMUSCULAR | Status: DC | PRN
  Administered 2013-05-17: 2 mg via INTRAVENOUS

## 2013-05-17 MED ORDER — OXYCODONE HCL 5 MG/5ML PO SOLN: 5.0000 mg | Freq: Once | ORAL | Status: DC | PRN

## 2013-05-17 SURGICAL SUPPLY — 71 items
BANDAGE GAUZE ELAST BULKY 4 IN (GAUZE/BANDAGES/DRESSINGS) ×4 IMPLANT
BENZOIN TINCTURE PRP APPL 2/3 (GAUZE/BANDAGES/DRESSINGS) IMPLANT
BLADE MINI RND TIP GREEN BEAV (BLADE) ×4 IMPLANT
BLADE SURG 15 STRL LF DISP TIS (BLADE) ×6 IMPLANT
BLADE SURG 15 STRL SS (BLADE) ×2
BLADE SURG ROTATE 9660 (MISCELLANEOUS) ×4 IMPLANT
BNDG COHESIVE 3X5 TAN STRL LF (GAUZE/BANDAGES/DRESSINGS) ×4 IMPLANT
BNDG ESMARK 4X9 LF (GAUZE/BANDAGES/DRESSINGS) ×4 IMPLANT
CHLORAPREP W/TINT 26ML (MISCELLANEOUS) ×8 IMPLANT
CORDS BIPOLAR (ELECTRODE) ×4 IMPLANT
COVER MAYO STAND STRL (DRAPES) ×8 IMPLANT
COVER TABLE BACK 60X90 (DRAPES) ×4 IMPLANT
CUFF TOURNIQUET SINGLE 18IN (TOURNIQUET CUFF) ×4 IMPLANT
DECANTER SPIKE VIAL GLASS SM (MISCELLANEOUS) ×4 IMPLANT
DERMABOND ADVANCED (GAUZE/BANDAGES/DRESSINGS) ×1
DERMABOND ADVANCED .7 DNX12 (GAUZE/BANDAGES/DRESSINGS) ×3 IMPLANT
DRAPE EXTREMITY T 121X128X90 (DRAPE) ×4 IMPLANT
DRAPE LAPAROTOMY TRNSV 102X78 (DRAPE) ×4 IMPLANT
DRAPE SURG 17X23 STRL (DRAPES) ×4 IMPLANT
DRAPE UTILITY XL STRL (DRAPES) ×4 IMPLANT
DRSG KUZMA FLUFF (GAUZE/BANDAGES/DRESSINGS) IMPLANT
DRSG TEGADERM 4X4.75 (GAUZE/BANDAGES/DRESSINGS) IMPLANT
ELECT COATED BLADE 2.86 ST (ELECTRODE) ×4 IMPLANT
ELECT REM PT RETURN 9FT ADLT (ELECTROSURGICAL) ×4
ELECTRODE REM PT RTRN 9FT ADLT (ELECTROSURGICAL) ×3 IMPLANT
GAUZE SPONGE 4X4 12PLY STRL LF (GAUZE/BANDAGES/DRESSINGS) IMPLANT
GAUZE XEROFORM 1X8 LF (GAUZE/BANDAGES/DRESSINGS) ×4 IMPLANT
GLOVE BIO SURGEON STRL SZ 6.5 (GLOVE) IMPLANT
GLOVE BIO SURGEON STRL SZ7.5 (GLOVE) ×4 IMPLANT
GLOVE BIOGEL PI IND STRL 7.0 (GLOVE) ×9 IMPLANT
GLOVE BIOGEL PI INDICATOR 7.0 (GLOVE) ×3
GLOVE ECLIPSE 6.5 STRL STRAW (GLOVE) ×8 IMPLANT
GLOVE SURG ORTHO 8.0 STRL STRW (GLOVE) ×8 IMPLANT
GOWN BRE IMP PREV XXLGXLNG (GOWN DISPOSABLE) ×4 IMPLANT
GOWN PREVENTION PLUS XLARGE (GOWN DISPOSABLE) ×12 IMPLANT
LOOP VESSEL MAXI BLUE (MISCELLANEOUS) ×4 IMPLANT
NEEDLE HYPO 22GX1.5 SAFETY (NEEDLE) ×4 IMPLANT
NEEDLE HYPO 25X1 1.5 SAFETY (NEEDLE) ×4 IMPLANT
NS IRRIG 1000ML POUR BTL (IV SOLUTION) ×4 IMPLANT
PACK BASIN DAY SURGERY FS (CUSTOM PROCEDURE TRAY) ×4 IMPLANT
PAD CAST 3X4 CTTN HI CHSV (CAST SUPPLIES) ×3 IMPLANT
PADDING CAST ABS 3INX4YD NS (CAST SUPPLIES)
PADDING CAST ABS 4INX4YD NS (CAST SUPPLIES) ×1
PADDING CAST ABS COTTON 3X4 (CAST SUPPLIES) IMPLANT
PADDING CAST ABS COTTON 4X4 ST (CAST SUPPLIES) ×3 IMPLANT
PADDING CAST COTTON 3X4 STRL (CAST SUPPLIES) ×1
PENCIL BUTTON HOLSTER BLD 10FT (ELECTRODE) ×4 IMPLANT
SLEEVE SCD COMPRESS KNEE MED (MISCELLANEOUS) ×4 IMPLANT
SPLINT PLASTER CAST XFAST 3X15 (CAST SUPPLIES) ×30 IMPLANT
SPLINT PLASTER XTRA FASTSET 3X (CAST SUPPLIES) ×10
SPONGE GAUZE 4X4 12PLY (GAUZE/BANDAGES/DRESSINGS) ×4 IMPLANT
SPONGE LAP 18X18 X RAY DECT (DISPOSABLE) ×4 IMPLANT
STOCKINETTE 4X48 STRL (DRAPES) ×4 IMPLANT
STRIP CLOSURE SKIN 1/2X4 (GAUZE/BANDAGES/DRESSINGS) IMPLANT
SUT MON AB 4-0 PC3 18 (SUTURE) ×4 IMPLANT
SUT NOVA NAB DX-16 0-1 5-0 T12 (SUTURE) ×4 IMPLANT
SUT SILK 2 0 FS (SUTURE) ×4 IMPLANT
SUT SILK 3 0 SH 30 (SUTURE) IMPLANT
SUT VIC AB 2-0 SH 27 (SUTURE) ×1
SUT VIC AB 2-0 SH 27XBRD (SUTURE) ×3 IMPLANT
SUT VIC AB 3-0 54X BRD REEL (SUTURE) IMPLANT
SUT VIC AB 3-0 BRD 54 (SUTURE)
SUT VIC AB 3-0 SH 27 (SUTURE) ×1
SUT VIC AB 3-0 SH 27X BRD (SUTURE) ×3 IMPLANT
SUT VICRYL RAPID 5 0 P 3 (SUTURE) IMPLANT
SUT VICRYL RAPIDE 4/0 PS 2 (SUTURE) ×4 IMPLANT
SYR BULB 3OZ (MISCELLANEOUS) ×4 IMPLANT
SYR CONTROL 10ML LL (SYRINGE) ×4 IMPLANT
TOWEL OR 17X24 6PK STRL BLUE (TOWEL DISPOSABLE) ×16 IMPLANT
TOWEL OR NON WOVEN STRL DISP B (DISPOSABLE) ×4 IMPLANT
UNDERPAD 30X30 INCONTINENT (UNDERPADS AND DIAPERS) ×4 IMPLANT

## 2013-05-17 NOTE — Op Note (Signed)
05/17/2013  2:11 PM  PATIENT:  John Benton  52 y.o. male  PRE-OPERATIVE DIAGNOSIS:   UMBILICAL HERNIA  POST-OPERATIVE DIAGNOSIS:  UMBILICAL HERNIA  PROCEDURE:  Procedure(s): HERNIA REPAIR UMBILICAL ADULT (N/A)  SURGEON:  Surgeon(s) and Role:     * Robyne Askew, MD - Primary  PHYSICIAN ASSISTANT:   ASSISTANTS: none   ANESTHESIA:   general  EBL:  Total I/O In: 1300 [I.V.:1300] Out: -   BLOOD ADMINISTERED:none  DRAINS: none   LOCAL MEDICATIONS USED:  MARCAINE     SPECIMEN:  No Specimen  DISPOSITION OF SPECIMEN:  N/A  COUNTS:  YES  DICTATION: .Dragon Dictation After informed consent was obtained the patient was brought to the operating room and placed in the supine position on the operating room table. After adequate induction of general anesthesia the patient's abdomen was prepped with ChloraPrep, wet-to-dry, and draped in usual sterile manner. The area around the umbilicus was infiltrated with quarter percent Marcaine. A small transversely oriented incision was made at the inferior edge of the umbilicus with a 15 blade knife. This incision was carried through the skin and subcutaneous change tissue sharply with electrocautery. The hernia defect was identified. The hernia was separated from the skin sharply with the electrocautery. The hernia was then able to be easily reduced. The peritoneum was not violated. The actual fascial defect was only a few millimeters across. The fascial defect was closed with interrupted #1 Novafil stitches. The wound was then irrigated with copious amounts of saline. The deep layer the wound was closed with interrupted 2-0 Vicryl stitches. The skin was then closed with interrupted 4-0 Monocryl subcuticular stitches. Dermabond dressings were applied. The patient tolerated the procedure well. At the end of the case the needle sponge and instrument counts were correct. The patient was then awakened and taken to recovery in stable condition.  PLAN  OF CARE: Discharge to home after PACU  PATIENT DISPOSITION:  PACU - hemodynamically stable.   Delay start of Pharmacological VTE agent (>24hrs) due to surgical blood loss or risk of bleeding: not applicable

## 2013-05-17 NOTE — Interval H&P Note (Signed)
History and Physical Interval Note:  05/17/2013 12:40 PM  John Benton  has presented today for surgery, with the diagnosis of DUPUYTRENS LEFT SMALL RING MIDDLE  The various methods of treatment have been discussed with the patient and family. After consideration of risks, benefits and other options for treatment, the patient has consented to  Procedure(s): FASCIECTOMY LEFT SMALL, RING, MIDDLE (Left) HERNIA REPAIR UMBILICAL ADULT (N/A) INSERTION OF MESH (N/A) as a surgical intervention .  The patient's history has been reviewed, patient examined, no change in status, stable for surgery.  I have reviewed the patient's chart and labs.  Questions were answered to the patient's satisfaction.     TOTH III,PAUL S

## 2013-05-17 NOTE — Anesthesia Preprocedure Evaluation (Signed)
Anesthesia Evaluation  Patient identified by MRN, date of birth, ID band Patient awake    Reviewed: Allergy & Precautions, H&P , NPO status , Patient's Chart, lab work & pertinent test results  Airway Mallampati: I TM Distance: >3 FB Neck ROM: Full    Dental  (+) Teeth Intact and Dental Advisory Given   Pulmonary  breath sounds clear to auscultation        Cardiovascular Rhythm:Regular Rate:Normal     Neuro/Psych    GI/Hepatic GERD-  Medicated and Controlled,  Endo/Other    Renal/GU      Musculoskeletal   Abdominal   Peds  Hematology   Anesthesia Other Findings   Reproductive/Obstetrics                           Anesthesia Physical Anesthesia Plan  ASA: I  Anesthesia Plan: General   Post-op Pain Management:    Induction: Intravenous  Airway Management Planned: LMA  Additional Equipment:   Intra-op Plan:   Post-operative Plan: Extubation in OR  Informed Consent: I have reviewed the patients History and Physical, chart, labs and discussed the procedure including the risks, benefits and alternatives for the proposed anesthesia with the patient or authorized representative who has indicated his/her understanding and acceptance.   Dental advisory given  Plan Discussed with: CRNA, Anesthesiologist and Surgeon  Anesthesia Plan Comments:         Anesthesia Quick Evaluation

## 2013-05-17 NOTE — Transfer of Care (Signed)
Immediate Anesthesia Transfer of Care Note  Patient: John Benton  Procedure(s) Performed: Procedure(s): FASCIECTOMY LEFT SMALL, RING, MIDDLE FINGERS (Left) HERNIA REPAIR UMBILICAL ADULT (N/A)  Patient Location: PACU  Anesthesia Type:General  Level of Consciousness: awake, alert  and oriented  Airway & Oxygen Therapy: Patient Spontanous Breathing and Patient connected to face mask oxygen  Post-op Assessment: Report given to PACU RN and Post -op Vital signs reviewed and stable  Post vital signs: Reviewed and stable  Complications: No apparent anesthesia complications

## 2013-05-17 NOTE — Brief Op Note (Signed)
05/17/2013  1:44 PM  PATIENT:  John Benton  52 y.o. male  PRE-OPERATIVE DIAGNOSIS:  DUPUYTRENS LEFT SMALL, RING, MIDDLE FINGERS AND UMBILICAL HERNIA  POST-OPERATIVE DIAGNOSIS:  DUPUYTRENS LEFT SMALL, RING, MIDDLE FINGERS AND UMBILICAL HERNIA  PROCEDURE:  Procedure(s): FASCIECTOMY LEFT SMALL, RING, MIDDLE FINGERS (Left) HERNIA REPAIR UMBILICAL ADULT (N/A) INSERTION OF MESH (N/A)  SURGEON:  Surgeon(s) and Role: Panel 1:    * Nicki Reaper, MD - Primary  Panel 2:    * Robyne Askew, MD - Primary  PHYSICIAN ASSISTANT:   ASSISTANTS: none   ANESTHESIA:   local and general  EBL:  Total I/O In: 1000 [I.V.:1000] Out: -   BLOOD ADMINISTERED:none  DRAINS: none   LOCAL MEDICATIONS USED:  MARCAINE     SPECIMEN:  Excision  DISPOSITION OF SPECIMEN:  PATHOLOGY  COUNTS:  YES  TOURNIQUET:   Total Tourniquet Time Documented: Upper Arm (Left) - 44 minutes Total: Upper Arm (Left) - 44 minutes   DICTATION: .Other Dictation: Dictation Number [id number]  PLAN OF CARE: Discharge to home after PACU  PATIENT DISPOSITION:  PACU - hemodynamically stable.

## 2013-05-17 NOTE — H&P (Signed)
John Benton is a 52 year old right hand dominant male s/p Dupuytren's excision with a fasciectomy of his right middle, ring and little fingers. He states that his left little finger has now begun to progress. He states this has significantly changed over the past 3 months. He has no new history of injury.  PAST MEDICAL HISTORY: He has an allergy to PCN. He is on Omeprazole, Simvastatin, calcium, multivitamins and fish oil. He has had both shoulders, C-spine fusion, hernia and Dupuytren's excision.   FAMILY H ISTORY: Positive for high BP and arthritis.  SOCIAL HISTORY: He does not smoke. He drinks socially. 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   REVIEW OF SYSTEMS: Positive for glasses, cataracts, otherwise negative. CHARLE Benton is an 52 y.o. male.   Chief Complaint: Dupuytrens contracture Left hand  HPI: see above  Past Medical History  Diagnosis Date  . No pertinent past medical history   . Hyperlipemia   . Arthritis   . GERD (gastroesophageal reflux disease)   . Wears glasses     Past Surgical History  Procedure Laterality Date  . Shoulder arthroscopy w/ rotator cuff repair  2010    left  . Spermatocelectomy  2010  . Inguinal hernia repair  2002    lt  . Cervical fusion  2006  . Colonoscopy    . Dupuytren contracture release  02/02/2012    Procedure: DUPUYTREN CONTRACTURE RELEASE;  Surgeon: Nicki Reaper, MD;  Location: [facility];  Service: Orthopedics;  Laterality: Right;  fasciectomy right little, middle, and ring fingers of right hand.  . Shoulder arthroscopy      right x2    Family History  Problem Relation Age of Onset  . Kidney disease Mother   . Cancer Sister     Breast  . Cancer Maternal Grandmother     Breast   Social History:  reports that he has quit smoking. He quit smokeless tobacco use about 24 years ago. He reports that he drinks alcohol. He reports that he does not use illicit drugs.  Allergies:  Allergies   Allergen Reactions  . Penicillins Anaphylaxis    Medications Prior to Admission  Medication Sig Dispense Refill  . calcium carbonate 200 MG capsule Take 250 mg by mouth 2 (two) times daily with a meal.      . co-enzyme Q-10 30 MG capsule Take 30 mg by mouth 3 (three) times daily.      . fish oil-omega-3 fatty acids 1000 MG capsule Take 2 g by mouth daily.      . Multiple Vitamins-Minerals (MULTIVITAMIN WITH MINERALS) tablet Take 1 tablet by mouth daily.      Marland Kitchen omeprazole (PRILOSEC) 20 MG capsule Take 20 mg by mouth daily.      . simvastatin (ZOCOR) 40 MG tablet Take 40 mg by mouth at bedtime.        No results found for this or any previous visit (from the past 48 hour(s)).  No results found.   Pertinent items are noted in HPI.  Blood pressure 154/99, pulse 63, temperature 97.8 F (36.6 C), temperature source Oral, resp. rate 20, height 5\' 10"  (1.778 m), weight 224 lb (101.606 kg), SpO2 99.00%.  General appearance: alert, cooperative and appears stated age Head: Normocephalic, without obvious abnormality Neck: no JVD Resp: Benton to auscultation bilaterally Cardio: regular rate and rhythm, S1, S2 normal, no murmur, click, rub or gallop GI: soft, non-tender; bowel sounds normal; no masses,  no organomegaly Extremities: extremities normal, atraumatic, no cyanosis or edema Pulses: 2+ and symmetric Skin: Skin color, texture, turgor normal. No rashes or lesions Neurologic: Grossly normal Incision/Wound: na  Assessment/Plan DX Dupuytren's contracture left middle ring and small He desires proceeding to have this taken care of surgically. He is scheduled for fasciectomy middle, ring and small fingers. This will be scheduled as an outpatient under regional anesthesia.  John Benton R 05/17/2013, 11:18 AM

## 2013-05-17 NOTE — H&P (View-Only) (Signed)
Patient ID: John Benton, male   DOB: 08/17/1960, 52 y.o.   MRN: [id number]  Chief Complaint  Patient presents with  . New Evaluation    eval umb hernia    HPI John Benton is a 52 y.o. male.  We are asked to see the patient in consultation by Dr. Fried to evaluate him for an umbilical hernia. The patient is a 52-year-old white male who was recently lifting heavy weights at the gym when he felt some discomfort at his bellybutton. Shortly after this he noticed a small bulge at his bellybutton. He is able to reduce it. He does have some discomfort associated with it. He denies any nausea or vomiting. His appetite is good and his bowels are working normally.  HPI  Past Medical History  Diagnosis Date  . No pertinent past medical history   . Hyperlipemia   . Arthritis   . GERD (gastroesophageal reflux disease)     Past Surgical History  Procedure Laterality Date  . Shoulder arthroscopy w/ rotator cuff repair  2010    left  . Spermatocelectomy  2010  . Inguinal hernia repair  2002    lt  . Cervical fusion  2006  . Colonoscopy    . Dupuytren contracture release  02/02/2012    Procedure: DUPUYTREN CONTRACTURE RELEASE;  Surgeon: Gary R Kuzma, MD;  Location: [facility];  Service: Orthopedics;  Laterality: Right;  fasciectomy right little, middle, and ring fingers of right hand.    Family History  Problem Relation Age of Onset  . Kidney disease Mother   . Cancer Sister     Breast  . Cancer Maternal Grandmother     Breast    Social History History  Substance Use Topics  . Smoking status: Former Smoker  . Smokeless tobacco: Former User    Quit date: 01/25/1989  . Alcohol Use: Yes     Comment: daily    Allergies  Allergen Reactions  . Penicillins Anaphylaxis    Current Outpatient Prescriptions  Medication Sig Dispense Refill  . calcium carbonate 200 MG capsule Take 250 mg by mouth 2 (two) times daily with a meal.      . co-enzyme Q-10 30 MG capsule Take 30  mg by mouth 3 (three) times daily.      . fish oil-omega-3 fatty acids 1000 MG capsule Take 2 g by mouth daily.      . Multiple Vitamins-Minerals (MULTIVITAMIN WITH MINERALS) tablet Take 1 tablet by mouth daily.      . omeprazole (PRILOSEC) 20 MG capsule Take 20 mg by mouth daily.      . simvastatin (ZOCOR) 40 MG tablet Take 40 mg by mouth at bedtime.       No current facility-administered medications for this visit.    Review of Systems Review of Systems  Constitutional: Negative.   HENT: Negative.   Eyes: Negative.   Respiratory: Negative.   Cardiovascular: Negative.   Gastrointestinal: Positive for abdominal pain.  Endocrine: Negative.   Genitourinary: Negative.   Musculoskeletal: Negative.   Skin: Negative.   Allergic/Immunologic: Negative.   Neurological: Negative.   Hematological: Negative.   Psychiatric/Behavioral: Negative.     Blood pressure 114/90, pulse 53, height 5' 10.5" (1.791 m), weight 223 lb 6.4 oz (101.334 kg), SpO2 98.00%.  Physical Exam Physical Exam  Constitutional: He is oriented to person, place, and time. He appears well-developed and well-nourished.  HENT:  Head: Normocephalic and atraumatic.  Eyes: Conjunctivae and EOM are   normal. Pupils are equal, round, and reactive to light.  Neck: Normal range of motion. Neck supple.  Cardiovascular: Normal rate, regular rhythm and normal heart sounds.   Pulmonary/Chest: Effort normal and breath sounds normal.  Abdominal: Soft. Bowel sounds are normal.  There is a small reducible umbilical hernia. There is some tenderness to palpation.  Musculoskeletal: Normal range of motion.  Neurological: He is alert and oriented to person, place, and time.  Skin: Skin is warm and dry.  Psychiatric: He has a normal mood and affect. His behavior is normal.    Data Reviewed As above  Assessment    The patient has a small but symptomatic umbilical hernia. Because of the risk of incarceration strenuousness think he would  benefit from having this fixed. He would also like to have this done. I have discussed with him in detail the risks and benefits of the operation to fix the hernia as well as some of the technical aspects including the use of mesh and he understands and wishes to proceed     Plan    Plan for umbilical hernia repair with mesh. We will need to coordinate with Dr. Kuzma Since he is also scheduled for hand surgery that day and would like to try to do this with one anesthetic.       TOTH III,PAUL S 04/25/2013, 12:20 PM    

## 2013-05-17 NOTE — Anesthesia Postprocedure Evaluation (Signed)
Anesthesia Post Note  Patient: John Benton  Procedure(s) Performed: Procedure(s) (LRB): FASCIECTOMY LEFT SMALL, RING, MIDDLE FINGERS (Left) HERNIA REPAIR UMBILICAL ADULT (N/A)  Anesthesia type: General  Patient location: PACU  Post pain: Pain level controlled and Adequate analgesia  Post assessment: Post-op Vital signs reviewed, Patient's Cardiovascular Status Stable, Respiratory Function Stable, Patent Airway and Pain level controlled  Last Vitals:  Filed Vitals:   05/17/13 1530  BP: 132/85  Pulse: 63  Temp:   Resp: 12    Post vital signs: Reviewed and stable  Level of consciousness: awake, alert  and oriented  Complications: No apparent anesthesia complications

## 2013-05-17 NOTE — Op Note (Signed)
Dictation Number [id number]

## 2013-05-18 ENCOUNTER — Other Ambulatory Visit: Payer: Self-pay

## 2013-05-18 NOTE — Op Note (Signed)
NAME:  HANS, RUSHER NO.:  [id number]  MEDICAL RECORD NO.:  [id number]  LOCATION:                                 FACILITY:  PHYSICIAN:  Cindee Salt, M.D.            DATE OF BIRTH:  DATE OF PROCEDURE:  05/17/2013 DATE OF DISCHARGE:                              OPERATIVE REPORT   PREOPERATIVE DIAGNOSIS:  Dupuytren contracture, middle ring and small fingers, left hand.  POSTOPERATIVE DIAGNOSIS:  Dupuytren contracture, middle ring and small fingers, left hand.  OPERATION:  Palmar fasciectomy, middle ring and little fingers, left hand.  SURGEON:  Cindee Salt, MD  ANESTHESIA:  General with local infiltration.  ANESTHESIOLOGIST:  Sheldon Silvan, MD  HISTORY:  The patient is a 52 year old male with a history of Dupuytren's contracture, he has undergone palmar fasciectomy on his right, he is admitted now for his left pre, peri, postop course have been discussed along with risks and complications.  He is aware that there is no guarantee with the surgery; possibility of infection; recurrence of injury to arteries, nerves, tendons, incomplete relief of symptoms, dystrophy, possibility of loss of finger, hematoma formation, skin loss in the preoperative area, the patient is seen, the extremity marked by both patient and surgeon.  Antibiotic given.  PROCEDURE IN DETAIL:  The patient was brought to the operating room where a general anesthetic was carried out without difficulty.  He was prepped using ChloraPrep, supine position with the left arm free.  A 3- minute dry time was allowed.  Time-out taken, confirming patient procedure.  The palmar zigzag incisions were made for the middle, ring, and small finger carried down through subcutaneous tissue.  Palmar fascia was identified, this was traced proximally, transected at the junction to the flexor retinaculum.  The ring finger was attended to first and cord was identified, traced distally to its insertion above the  A1, A2 pulley.  Neurovascular bundles were identified radial and ulnarly protected throughout the procedure.  The specimen was sent to Pathology.  Separate incision was then made on the middle finger, palm carried down through subcutaneous tissue.  Again the cord identified, transected proximally, followed distally protecting neurovascular structures.  This also inserted into the proximal phalanx on either side of the A2 pulley.  This was excised in toto, protecting neurovascular structures, both radially and ulnarly.  The little finger was attended to next.  The cord proximally was relatively small, this was followed distally.  He had large abductor digiti quinti cord, thus exactly an incision was made.  Neurovascular bundles radially and ulnarly were identified.  These were then traced distally protecting these, the entire cord and lateral digital sheet was removed out to the distal portion of the middle phalanx.  This was excised in toto.  The neurovascular bundle was intact along its entire course.  Specimen was sent to Pathology.  The wound was copiously irrigated with saline, then sprayed with thrombin.  A doubled over vessel loop was placed through the depths of the wound.  The skin was then closed with interrupted 4-0 Vicryl Rapide sutures to each of the wounds.  A sterile compressive dressing  was applied.  The tourniquet deflated.  All fingers immediately pinked.  The dorsal splint applied.  The patient tolerated the procedure well, and he will be taken to the recovery room after completion of a umbilical hernia by Dr. Carolynne Edouard, that will be dictated separately.          ______________________________ Cindee Salt, M.D.     GK/MEDQ  D:  05/17/2013  T:  05/18/2013  Job:  [id number]

## 2013-05-19 ENCOUNTER — Encounter (HOSPITAL_BASED_OUTPATIENT_CLINIC_OR_DEPARTMENT_OTHER): Payer: Self-pay | Admitting: Orthopedic Surgery

## 2013-06-06 ENCOUNTER — Encounter (INDEPENDENT_AMBULATORY_CARE_PROVIDER_SITE_OTHER): Payer: Self-pay | Admitting: General Surgery

## 2013-06-06 ENCOUNTER — Ambulatory Visit (INDEPENDENT_AMBULATORY_CARE_PROVIDER_SITE_OTHER): Payer: BC Managed Care – PPO | Admitting: General Surgery

## 2013-06-06 VITALS — BP 130/82 | HR 72 | Temp 97.2°F | Resp 18 | Ht 71.0 in | Wt 226.6 lb

## 2013-06-06 DIAGNOSIS — K429 Umbilical hernia without obstruction or gangrene: Secondary | ICD-10-CM

## 2013-06-06 NOTE — Patient Instructions (Signed)
No heavy lifting for another month then return to normal activities

## 2013-06-06 NOTE — Progress Notes (Signed)
Subjective:     Patient ID: John Benton, male   DOB: 12-05-1960, 52 y.o.   MRN: [id number]  HPI The patient is a 52 -year-old white male who is about 2 weeks status post umbilical hernia repair. He is doing well and has no abdominal pain. His appetite is good his bowels are working normally.  Review of Systems     Objective:   Physical Exam On exam his abdomen is soft and nontender. His incision is healing nicely with no sign of infection. His abdominal wall feels solid with no palpable evidence of recurrence of the hernia    Assessment:     The patient is 2 weeks status post primary umbilical hernia repair     Plan:     At this point I would like him to refrain from any heavy lifting for another month. After that time he can return to his normal activities without prescriptions. We will plan to see him back on a when necessary basis.

## 2014-04-16 HISTORY — PX: OTHER SURGICAL HISTORY: SHX169

## 2014-06-20 NOTE — H&P (Signed)
TOTAL HIP ADMISSION H&P  Patient is admitted for left total hip arthroplasty, anterior approach.  Subjective:  Chief Complaint:    Left hip primary OA / pain  HPI: John Benton, 53 y.o. male, has a history of pain and functional disability in the left hip(s) due to arthritis and patient has failed non-surgical conservative treatments for greater than 12 weeks to include NSAID's and/or analgesics, corticosteriod injections and activity modification.  Onset of symptoms was gradual starting 2+ years ago with gradually worsening course since that time.The patient noted no past surgery on the left hip(s).  Patient currently rates pain in the left hip at 10 out of 10 with activity. Patient has night pain, worsening of pain with activity and weight bearing, trendelenberg gait, pain that interfers with activities of daily living and pain with passive range of motion. Patient has evidence of periarticular osteophytes and joint space narrowing by imaging studies. This condition presents safety issues increasing the risk of falls.   There is no current active infection.  Risks, benefits and expectations were discussed with the patient.  Risks including but not limited to the risk of anesthesia, blood clots, nerve damage, blood vessel damage, failure of the prosthesis, infection and up to and including death.  Patient understand the risks, benefits and expectations and wishes to proceed with surgery.   PCP: Abigail Miyamoto, MD  D/C Plans:      Home with HHPT  Post-op Meds:       No Rx given  Tranexamic Acid:      To be given - IV    Decadron:      Is to be given  FYI:     ASA  Norco     Patient Active Problem List   Diagnosis Date Noted  . Umbilical hernia 50/27/7412   Past Medical History  Diagnosis Date  . No pertinent past medical history   . Hyperlipemia   . Arthritis   . GERD (gastroesophageal reflux disease)   . Wears glasses     Past Surgical History  Procedure Laterality Date  .  Shoulder arthroscopy w/ rotator cuff repair  2010    left  . Spermatocelectomy  2010  . Inguinal hernia repair  2002    lt  . Cervical fusion  2006  . Colonoscopy    . Dupuytren contracture release  02/02/2012    Procedure: DUPUYTREN CONTRACTURE RELEASE;  Surgeon: Wynonia Sours, MD;  Location: [facility];  Service: Orthopedics;  Laterality: Right;  fasciectomy right little, middle, and ring fingers of right hand.  . Shoulder arthroscopy      right x2  . Fasciotomy Left 05/17/2013    Procedure: FASCIECTOMY LEFT SMALL, RING, MIDDLE FINGERS;  Surgeon: Wynonia Sours, MD;  Location: [facility];  Service: Orthopedics;  Laterality: Left;  . Umbilical hernia repair N/A 05/17/2013    Procedure: HERNIA REPAIR UMBILICAL ADULT;  Surgeon: Merrie Roof, MD;  Location: [facility];  Service: General;  Laterality: N/A;    No prescriptions prior to admission   Allergies  Allergen Reactions  . Penicillins Anaphylaxis    History  Substance Use Topics  . Smoking status: Former Research scientist (life sciences)  . Smokeless tobacco: Former Systems developer    Quit date: 01/25/1989  . Alcohol Use: Yes     Comment: daily    Family History  Problem Relation Age of Onset  . Kidney disease Mother   . Cancer Sister  Breast  . Cancer Maternal Grandmother     Breast     Review of Systems  Constitutional: Negative.   HENT: Negative.   Eyes: Negative.   Respiratory: Negative.   Cardiovascular: Negative.   Gastrointestinal: Positive for heartburn.  Genitourinary: Negative.   Musculoskeletal: Positive for joint pain.  Skin: Negative.   Neurological: Negative.   Endo/Heme/Allergies: Negative.   Psychiatric/Behavioral: Negative.     Objective:  Physical Exam  Constitutional: He is oriented to person, place, and time. He appears well-developed and well-nourished.  HENT:  Head: Normocephalic and atraumatic.  Eyes: Pupils are equal, round, and reactive to light.  Neck: Neck supple. No  JVD present. No tracheal deviation present. No thyromegaly present.  Cardiovascular: Normal rate, regular rhythm, normal heart sounds and intact distal pulses.   Respiratory: Effort normal and breath sounds normal. No stridor. No respiratory distress. He has no wheezes.  GI: Soft. There is no tenderness. There is no guarding.  Musculoskeletal:       Left hip: He exhibits decreased range of motion, decreased strength, tenderness and bony tenderness. He exhibits no swelling, no deformity and no laceration.  Lymphadenopathy:    He has no cervical adenopathy.  Neurological: He is alert and oriented to person, place, and time.  Skin: Skin is warm and dry.  Psychiatric: He has a normal mood and affect.      Labs:  Estimated body mass index is 31.62 kg/(m^2) as calculated from the following:   Height as of 06/06/13: 5\' 11"  (1.803 m).   Weight as of 06/06/13: 102.785 kg (226 lb 9.6 oz).   Imaging Review Plain radiographs demonstrate severe degenerative joint disease of the left hip(s). The bone quality appears to be good for age and reported activity level.  Assessment/Plan:  End stage arthritis, left hip(s)  The patient history, physical examination, clinical judgement of the provider and imaging studies are consistent with end stage degenerative joint disease of the left hip(s) and total hip arthroplasty is deemed medically necessary. The treatment options including medical management, injection therapy, arthroscopy and arthroplasty were discussed at length. The risks and benefits of total hip arthroplasty were presented and reviewed. The risks due to aseptic loosening, infection, stiffness, dislocation/subluxation,  thromboembolic complications and other imponderables were discussed.  The patient acknowledged the explanation, agreed to proceed with the plan and consent was signed. Patient is being admitted for inpatient treatment for surgery, pain control, PT, OT, prophylactic antibiotics,  VTE prophylaxis, progressive ambulation and ADL's and discharge planning.The patient is planning to be discharged home with home health services.     [place] Pugh Gedeon Brandow   PA-C  06/20/2014, 11:00 PM

## 2014-06-26 ENCOUNTER — Ambulatory Visit (HOSPITAL_COMMUNITY)
Admission: RE | Admit: 2014-06-26 | Discharge: 2014-06-26 | Disposition: A | Discharge status: Home / Self Care | Payer: BC Managed Care – PPO | Source: Ambulatory Visit | Attending: Anesthesiology | Admitting: Anesthesiology

## 2014-06-26 ENCOUNTER — Encounter (HOSPITAL_COMMUNITY)
Admission: RE | Admit: 2014-06-26 | Discharge: 2014-06-26 | Disposition: A | Discharge status: Home / Self Care | Payer: BC Managed Care – PPO | Source: Ambulatory Visit | Attending: Orthopedic Surgery | Admitting: Orthopedic Surgery

## 2014-06-26 ENCOUNTER — Encounter (HOSPITAL_COMMUNITY): Payer: Self-pay

## 2014-06-26 DIAGNOSIS — Z01818 Encounter for other preprocedural examination: Secondary | ICD-10-CM | POA: Insufficient documentation

## 2014-06-26 DIAGNOSIS — I10 Essential (primary) hypertension: Secondary | ICD-10-CM

## 2014-06-26 HISTORY — DX: Essential (primary) hypertension: I10

## 2014-06-26 HISTORY — DX: Malignant (primary) neoplasm, unspecified: C80.1

## 2014-06-26 LAB — BASIC METABOLIC PANEL
ANION GAP: 13 (ref 5–15)
BUN: 17 mg/dL (ref 6–23)
CHLORIDE: 99 meq/L (ref 96–112)
CO2: 25 mEq/L (ref 19–32)
Calcium: 10.3 mg/dL (ref 8.4–10.5)
Creatinine, Ser: 0.93 mg/dL (ref 0.50–1.35)
GFR calc Af Amer: >90 mL/min (ref >90)
GFR calc non Af Amer: >90 mL/min (ref >90)
GLUCOSE: 110 mg/dL — AB (ref 70–99)
Potassium: 4.9 mEq/L (ref 3.7–5.3)
Sodium: 137 mEq/L (ref 137–147)

## 2014-06-26 LAB — URINALYSIS, ROUTINE W REFLEX MICROSCOPIC
Bilirubin Urine: NEGATIVE
Glucose, UA: NEGATIVE mg/dL
Hgb urine dipstick: NEGATIVE
KETONES UR: NEGATIVE mg/dL
LEUKOCYTES UA: NEGATIVE
NITRITE: NEGATIVE
PROTEIN: NEGATIVE mg/dL
Specific Gravity, Urine: 1.02 (ref 1.005–1.030)
Urobilinogen, UA: 0.2 mg/dL (ref 0.0–1.0)
pH: 6.5 (ref 5.0–8.0)

## 2014-06-26 LAB — ABO/RH: ABO/RH(D): A NEG

## 2014-06-26 LAB — SURGICAL PCR SCREEN
MRSA, PCR: NEGATIVE
Staphylococcus aureus: NEGATIVE

## 2014-06-26 LAB — APTT: APTT: 29 s (ref 24–37)

## 2014-06-26 LAB — CBC
HEMATOCRIT: 47.5 % (ref 39.0–52.0)
Hemoglobin: 15.5 g/dL (ref 13.0–17.0)
MCH: 31 pg (ref 26.0–34.0)
MCHC: 32.6 g/dL (ref 30.0–36.0)
MCV: 95 fL (ref 78.0–100.0)
Platelets: 279 10*3/uL (ref 150–400)
RBC: 5 MIL/uL (ref 4.22–5.81)
RDW: 13.3 % (ref 11.5–15.5)
WBC: 5.7 10*3/uL (ref 4.0–10.5)

## 2014-06-26 LAB — PROTIME-INR
INR: 0.91 (ref 0.00–1.49)
Prothrombin Time: 12.4 seconds (ref 11.6–15.2)

## 2014-06-26 NOTE — Progress Notes (Signed)
   06/26/14 0824  OBSTRUCTIVE SLEEP APNEA  Have you ever been diagnosed with sleep apnea through a sleep study? No  Do you snore loudly (loud enough to be heard through closed doors)?  0  Do you often feel tired, fatigued, or sleepy during the daytime? 0  Has anyone observed you stop breathing during your sleep? 0  Do you have, or are you being treated for high blood pressure? 1  BMI more than 35 kg/m2? 0  Age over 55 years old? 1  Neck circumference greater than 40 cm/16 inches? 1  Gender: 1  Obstructive Sleep Apnea Score 4

## 2014-06-26 NOTE — Pre-Procedure Instructions (Signed)
PT HAS EKG REPORT ON HIS CHART FROM DR. FRIED - DONE 05/28/14. PT STATES HE HAS FOLLOW UP APT WITH DR. Maceo Pro TODAY REGARDING HIS B/P BEING ELEVATED - HE WAS JUST STARTED ON B/P MEDICATION 30 DAYS AGO. CXR WAS DONE TODAY PREOP.

## 2014-06-26 NOTE — Patient Instructions (Signed)
John Benton  06/26/2014   Your procedure is scheduled on:   Tuesday  December 22nd  Report to [facility] at 8:30 AM.  Call this number if you have problems the morning of surgery [phone]   Remember:  Do not eat food or drink liquids :After Midnight.     Take these medicines the morning of surgery with A SIP OF WATER:   OMEPRAZOLE                               You may not have any metal on your body including hair pins and              piercings  Do not wear jewelry, make-up, lotions, powders or perfumes.             Do not wear nail polish.  Do not shave  48 hours prior to surgery.              Men may shave face and neck.   Do not bring valuables to the hospital. [name].  Contacts, dentures or bridgework may not be worn into surgery.  Leave suitcase in the car. After surgery it may be brought to your room.     Patients discharged the day of surgery will not be allowed to drive home.  Name and phone number of your driver: N/A  Special Instructions: N/A              Please read over the following fact sheets you were given: _____________________________________________________________________             [facility] - Preparing for Surgery Before surgery, you can play an important role.  Because skin is not sterile, your skin needs to be as free of germs as possible.  You can reduce the number of germs on your skin by washing with CHG (chlorahexidine gluconate) soap before surgery.  CHG is an antiseptic cleaner which kills germs and bonds with the skin to continue killing germs even after washing. Please DO NOT use if you have an allergy to CHG or antibacterial soaps.  If your skin becomes reddened/irritated stop using the CHG and inform your nurse when you arrive at Short Stay. Do not shave (including legs and underarms) for at least 48 hours prior to the first CHG  shower.  You may shave your face/neck. Please follow these instructions carefully:  1.  Shower with CHG Soap the night before surgery and the  morning of Surgery.  2.  If you choose to wash your hair, wash your hair first as usual with your  normal  shampoo.  3.  After you shampoo, rinse your hair and body thoroughly to remove the  shampoo.                           4.  Use CHG as you would any other liquid soap.  You can apply chg directly  to the skin and wash                       Gently  with a scrungie or clean washcloth.  5.  Apply the CHG Soap to your body ONLY FROM THE NECK DOWN.   Do not use on face/ open                           Wound or open sores. Avoid contact with eyes, ears mouth and genitals (private parts).                       Wash face,  Genitals (private parts) with your normal soap.             6.  Wash thoroughly, paying special attention to the area where your surgery  will be performed.  7.  Thoroughly rinse your body with warm water from the neck down.  8.  DO NOT shower/wash with your normal soap after using and rinsing off  the CHG Soap.                9.  Pat yourself dry with a clean towel.            10.  Wear clean pajamas.            11.  Place clean sheets on your bed the night of your first shower and do not  sleep with pets. Day of Surgery : Do not apply any lotions/deodorants the morning of surgery.  Please wear clean clothes to the hospital/surgery center.  FAILURE TO FOLLOW THESE INSTRUCTIONS MAY RESULT IN THE CANCELLATION OF YOUR SURGERY PATIENT SIGNATURE_________________________________  NURSE SIGNATURE__________________________________  ________________________________________________________________________   John Benton  An incentive spirometer is a tool that can help keep your lungs clear and active. This tool measures how well you are filling your lungs with each breath. Taking long deep breaths may help reverse or decrease the chance  of developing breathing (pulmonary) problems (especially infection) following:  A long period of time when you are unable to move or be active. BEFORE THE PROCEDURE   If the spirometer includes an indicator to show your best effort, your nurse or respiratory therapist will set it to a desired goal.  If possible, sit up straight or lean slightly forward. Try not to slouch.  Hold the incentive spirometer in an upright position. INSTRUCTIONS FOR USE   Sit on the edge of your bed if possible, or sit up as far as you can in bed or on a chair.  Hold the incentive spirometer in an upright position.  Breathe out normally.  Place the mouthpiece in your mouth and seal your lips tightly around it.  Breathe in slowly and as deeply as possible, raising the piston or the ball toward the top of the column.  Hold your breath for 3-5 seconds or for as long as possible. Allow the piston or ball to fall to the bottom of the column.  Remove the mouthpiece from your mouth and breathe out normally.  Rest for a few seconds and repeat Steps 1 through 7 at least 10 times every 1-2 hours when you are awake. Take your time and take a few normal breaths between deep breaths.  The spirometer may include an indicator to show your best effort. Use the indicator as a goal to work toward during each repetition.  After each set of 10 deep breaths, practice coughing to be sure your lungs are clear. If you have an incision (the cut made at the time of surgery), support  your incision when coughing by placing a pillow or rolled up towels firmly against it. Once you are able to get out of bed, walk around indoors and cough well. You may stop using the incentive spirometer when instructed by your caregiver.  RISKS AND COMPLICATIONS  Take your time so you do not get dizzy or light-headed.  If you are in pain, you may need to take or ask for pain medication before doing incentive spirometry. It is harder to take a deep  breath if you are having pain. AFTER USE  Rest and breathe slowly and easily.  It can be helpful to keep track of a log of your progress. Your caregiver can provide you with a simple table to help with this. If you are using the spirometer at home, follow these instructions: [name] IF:   You are having difficultly using the spirometer.  You have trouble using the spirometer as often as instructed.  Your pain medication is not giving enough relief while using the spirometer.  You develop fever of 100.5 F (38.1 C) or higher. SEEK IMMEDIATE MEDICAL CARE IF:   You cough up bloody sputum that had not been present before.  You develop fever of 102 F (38.9 C) or greater.  You develop worsening pain at or near the incision site. MAKE SURE YOU:   Understand these instructions.  Will watch your condition.  Will get help right away if you are not doing well or get worse. Document Released: 11/09/2006 Document Revised: 09/21/2011 Document Reviewed: 01/10/2007 Exit[employer] Patient Information 2014 [employer], [place].   ________________________________________________________________________  WHAT IS A BLOOD TRANSFUSION? Blood Transfusion Information  A transfusion is the replacement of blood or some of its parts. Blood is made up of multiple cells which provide different functions.  Red blood cells carry oxygen and are used for blood loss replacement.  White blood cells fight against infection.  Platelets control bleeding.  Plasma helps clot blood.  Other blood products are available for specialized needs, such as hemophilia or other clotting disorders. BEFORE THE TRANSFUSION  Who gives blood for transfusions?   Healthy volunteers who are fully evaluated to make sure their blood is safe. This is blood bank blood. Transfusion therapy is the safest it has ever been in the practice of medicine. Before blood is taken from a donor, a complete history is taken to make sure  that person has no history of diseases nor engages in risky social behavior (examples are intravenous drug use or sexual activity with multiple partners). The donor's travel history is screened to minimize risk of transmitting infections, such as malaria. The donated blood is tested for signs of infectious diseases, such as HIV and hepatitis. The blood is then tested to be sure it is compatible with you in order to minimize the chance of a transfusion reaction. If you or a relative donates blood, this is often done in anticipation of surgery and is not appropriate for emergency situations. It takes many days to process the donated blood. RISKS AND COMPLICATIONS Although transfusion therapy is very safe and saves many lives, the main dangers of transfusion include:   Getting an infectious disease.  Developing a transfusion reaction. This is an allergic reaction to something in the blood you were given. Every precaution is taken to prevent this. The decision to have a blood transfusion has been considered carefully by your caregiver before blood is given. Blood is not given unless the benefits outweigh the risks. AFTER THE TRANSFUSION  Right after receiving a blood transfusion, you will usually feel much better and more energetic. This is especially true if your red blood cells have gotten low (anemic). The transfusion raises the level of the red blood cells which carry oxygen, and this usually causes an energy increase.  The nurse administering the transfusion will monitor you carefully for complications. HOME CARE INSTRUCTIONS  No special instructions are needed after a transfusion. You may find your energy is better. Speak with your caregiver about any limitations on activity for underlying diseases you may have. SEEK MEDICAL CARE IF:   Your condition is not improving after your transfusion.  You develop redness or irritation at the intravenous (IV) site. SEEK IMMEDIATE MEDICAL CARE IF:  Any of  the following symptoms occur over the next 12 hours:  Shaking chills.  You have a temperature by mouth above 102 F (38.9 C), not controlled by medicine.  Chest, back, or muscle pain.  People around you feel you are not acting correctly or are confused.  Shortness of breath or difficulty breathing.  Dizziness and fainting.  You get a rash or develop hives.  You have a decrease in urine output.  Your urine turns a dark color or changes to pink, red, or brown. Any of the following symptoms occur over the next 10 days:  You have a temperature by mouth above 102 F (38.9 C), not controlled by medicine.  Shortness of breath.  Weakness after normal activity.  The white part of the eye turns yellow (jaundice).  You have a decrease in the amount of urine or are urinating less often.  Your urine turns a dark color or changes to pink, red, or brown. Document Released: 06/26/2000 Document Revised: 09/21/2011 Document Reviewed: 02/13/2008 [facility] Patient Information 2014 [place], [place].  _______________________________________________________________________

## 2014-06-29 NOTE — Progress Notes (Signed)
Office visit note received from DR Maceo Pro dated 06/26/14 and 05/2014.  Please see note regarding increase in blood pressure medication per note related to hypertension.  Also clearance within note.

## 2014-07-02 MED ORDER — VANCOMYCIN HCL 10 G IV SOLR
1500.0000 mg | INTRAVENOUS | Status: AC
  Administered 2014-07-03: 1500 mg via INTRAVENOUS
  Filled 2014-07-02 (×2): qty 1500

## 2014-07-03 ENCOUNTER — Inpatient Hospital Stay (HOSPITAL_COMMUNITY): Payer: BC Managed Care – PPO

## 2014-07-03 ENCOUNTER — Encounter (HOSPITAL_COMMUNITY): Payer: Self-pay

## 2014-07-03 ENCOUNTER — Inpatient Hospital Stay (HOSPITAL_COMMUNITY): Payer: BC Managed Care – PPO | Admitting: Anesthesiology

## 2014-07-03 ENCOUNTER — Encounter (HOSPITAL_COMMUNITY)
Admission: RE | Disposition: A | Discharge status: Home / Self Care | Payer: Self-pay | Source: Ambulatory Visit | Attending: Orthopedic Surgery

## 2014-07-03 ENCOUNTER — Inpatient Hospital Stay (HOSPITAL_COMMUNITY)
Admission: RE | Admit: 2014-07-03 | Discharge: 2014-07-04 | DRG: 470 | Disposition: A | Discharge status: Home / Self Care | Payer: BC Managed Care – PPO | Source: Ambulatory Visit | Attending: Orthopedic Surgery | Admitting: Orthopedic Surgery

## 2014-07-03 DIAGNOSIS — E785 Hyperlipidemia, unspecified: Secondary | ICD-10-CM | POA: Diagnosis present

## 2014-07-03 DIAGNOSIS — K219 Gastro-esophageal reflux disease without esophagitis: Secondary | ICD-10-CM | POA: Diagnosis present

## 2014-07-03 DIAGNOSIS — Z981 Arthrodesis status: Secondary | ICD-10-CM

## 2014-07-03 DIAGNOSIS — Z880 Allergy status to penicillin: Secondary | ICD-10-CM

## 2014-07-03 DIAGNOSIS — Z96649 Presence of unspecified artificial hip joint: Secondary | ICD-10-CM

## 2014-07-03 DIAGNOSIS — E669 Obesity, unspecified: Secondary | ICD-10-CM | POA: Diagnosis present

## 2014-07-03 DIAGNOSIS — Z87891 Personal history of nicotine dependence: Secondary | ICD-10-CM

## 2014-07-03 DIAGNOSIS — M25552 Pain in left hip: Secondary | ICD-10-CM | POA: Diagnosis present

## 2014-07-03 DIAGNOSIS — Z6831 Body mass index (BMI) 31.0-31.9, adult: Secondary | ICD-10-CM

## 2014-07-03 DIAGNOSIS — M1612 Unilateral primary osteoarthritis, left hip: Principal | ICD-10-CM | POA: Diagnosis present

## 2014-07-03 HISTORY — PX: TOTAL HIP ARTHROPLASTY: SHX124

## 2014-07-03 LAB — TYPE AND SCREEN
ABO/RH(D): A NEG
Antibody Screen: NEGATIVE

## 2014-07-03 SURGERY — ARTHROPLASTY, HIP, TOTAL, ANTERIOR APPROACH
Anesthesia: Spinal | Site: Hip | Laterality: Left

## 2014-07-03 MED ORDER — HYDROMORPHONE HCL 1 MG/ML IJ SOLN
0.5000 mg | INTRAMUSCULAR | Status: DC | PRN
  Administered 2014-07-03 – 2014-07-04 (×4): 1 mg via INTRAVENOUS
  Filled 2014-07-03 (×4): qty 1

## 2014-07-03 MED ORDER — PROPOFOL 10 MG/ML IV BOLUS
INTRAVENOUS | Status: AC
  Filled 2014-07-03: qty 20

## 2014-07-03 MED ORDER — METHOCARBAMOL 500 MG PO TABS
500.0000 mg | ORAL_TABLET | Freq: Four times a day (QID) | ORAL | Status: DC | PRN
  Administered 2014-07-04 (×2): 500 mg via ORAL
  Filled 2014-07-03 (×2): qty 1

## 2014-07-03 MED ORDER — BISACODYL 10 MG RE SUPP: 10.0000 mg | Freq: Every day | RECTAL | Status: DC | PRN

## 2014-07-03 MED ORDER — ASPIRIN EC 325 MG PO TBEC
325.0000 mg | DELAYED_RELEASE_TABLET | Freq: Two times a day (BID) | ORAL | Status: DC
  Administered 2014-07-04: 325 mg via ORAL
  Filled 2014-07-03 (×3): qty 1

## 2014-07-03 MED ORDER — METOCLOPRAMIDE HCL 5 MG/ML IJ SOLN: 5.0000 mg | Freq: Three times a day (TID) | INTRAMUSCULAR | Status: DC | PRN

## 2014-07-03 MED ORDER — BUPIVACAINE HCL (PF) 0.5 % IJ SOLN
INTRAMUSCULAR | Status: DC | PRN
  Administered 2014-07-03: 3 mL

## 2014-07-03 MED ORDER — DEXTROSE 5 % IV SOLN
500.0000 mg | Freq: Four times a day (QID) | INTRAVENOUS | Status: DC | PRN
  Administered 2014-07-03: 500 mg via INTRAVENOUS
  Filled 2014-07-03 (×2): qty 5

## 2014-07-03 MED ORDER — PHENOL 1.4 % MT LIQD: 1.0000 | OROMUCOSAL | Status: DC | PRN

## 2014-07-03 MED ORDER — MEPERIDINE HCL 50 MG/ML IJ SOLN: 6.2500 mg | INTRAMUSCULAR | Status: DC | PRN

## 2014-07-03 MED ORDER — METOPROLOL SUCCINATE ER 100 MG PO TB24
100.0000 mg | ORAL_TABLET | Freq: Every day | ORAL | Status: DC
  Administered 2014-07-03: 100 mg via ORAL
  Filled 2014-07-03 (×2): qty 1

## 2014-07-03 MED ORDER — LACTATED RINGERS IV SOLN
INTRAVENOUS | Status: AC
  Administered 2014-07-03 (×2): via INTRAVENOUS
  Administered 2014-07-03: 1000 mL via INTRAVENOUS

## 2014-07-03 MED ORDER — ONDANSETRON HCL 4 MG/2ML IJ SOLN: 4.0000 mg | Freq: Four times a day (QID) | INTRAMUSCULAR | Status: DC | PRN

## 2014-07-03 MED ORDER — HYDROCODONE-ACETAMINOPHEN 7.5-325 MG PO TABS
1.0000 | ORAL_TABLET | ORAL | Status: DC
  Administered 2014-07-03 (×4): 1 via ORAL
  Administered 2014-07-04: 2 via ORAL
  Filled 2014-07-03: qty 1
  Filled 2014-07-03: qty 2
  Filled 2014-07-03: qty 1
  Filled 2014-07-03: qty 2

## 2014-07-03 MED ORDER — MIDAZOLAM HCL 5 MG/5ML IJ SOLN
INTRAMUSCULAR | Status: DC | PRN
  Administered 2014-07-03 (×2): 1 mg via INTRAVENOUS

## 2014-07-03 MED ORDER — DIPHENHYDRAMINE HCL 25 MG PO CAPS: 25.0000 mg | ORAL_CAPSULE | Freq: Four times a day (QID) | ORAL | Status: DC | PRN

## 2014-07-03 MED ORDER — DEXTROSE 5 % IV SOLN
10.0000 mg | INTRAVENOUS | Status: DC | PRN
  Administered 2014-07-03: 40 ug/min via INTRAVENOUS

## 2014-07-03 MED ORDER — DOCUSATE SODIUM 100 MG PO CAPS
100.0000 mg | ORAL_CAPSULE | Freq: Two times a day (BID) | ORAL | Status: DC
  Administered 2014-07-03 – 2014-07-04 (×2): 100 mg via ORAL

## 2014-07-03 MED ORDER — FERROUS SULFATE 325 (65 FE) MG PO TABS
325.0000 mg | ORAL_TABLET | Freq: Three times a day (TID) | ORAL | Status: DC
  Administered 2014-07-04 (×2): 325 mg via ORAL
  Filled 2014-07-03 (×4): qty 1

## 2014-07-03 MED ORDER — PROPOFOL 10 MG/ML IV BOLUS
INTRAVENOUS | Status: DC | PRN
  Administered 2014-07-03: 30 mg via INTRAVENOUS
  Administered 2014-07-03: 40 mg via INTRAVENOUS
  Administered 2014-07-03: 20 mg via INTRAVENOUS

## 2014-07-03 MED ORDER — VANCOMYCIN HCL IN DEXTROSE 1-5 GM/200ML-% IV SOLN
1000.0000 mg | Freq: Two times a day (BID) | INTRAVENOUS | Status: AC
  Administered 2014-07-03: 1000 mg via INTRAVENOUS
  Filled 2014-07-03: qty 200

## 2014-07-03 MED ORDER — MAGNESIUM CITRATE PO SOLN: 1.0000 | Freq: Once | ORAL | Status: AC | PRN

## 2014-07-03 MED ORDER — MIDAZOLAM HCL 2 MG/2ML IJ SOLN
INTRAMUSCULAR | Status: AC
  Filled 2014-07-03: qty 2

## 2014-07-03 MED ORDER — DEXAMETHASONE SODIUM PHOSPHATE 10 MG/ML IJ SOLN
10.0000 mg | Freq: Once | INTRAMUSCULAR | Status: AC
  Administered 2014-07-03: 10 mg via INTRAVENOUS

## 2014-07-03 MED ORDER — SODIUM CHLORIDE 0.9 % IV SOLN
100.0000 mL/h | INTRAVENOUS | Status: DC
  Administered 2014-07-03: 100 mL/h via INTRAVENOUS
  Filled 2014-07-03 (×4): qty 1000

## 2014-07-03 MED ORDER — CELECOXIB 200 MG PO CAPS
200.0000 mg | ORAL_CAPSULE | Freq: Two times a day (BID) | ORAL | Status: DC
  Administered 2014-07-03 – 2014-07-04 (×2): 200 mg via ORAL
  Filled 2014-07-03 (×3): qty 1

## 2014-07-03 MED ORDER — SODIUM CHLORIDE 0.9 % IR SOLN
Status: DC | PRN
  Administered 2014-07-03: 1000 mL

## 2014-07-03 MED ORDER — DEXAMETHASONE SODIUM PHOSPHATE 10 MG/ML IJ SOLN
10.0000 mg | Freq: Once | INTRAMUSCULAR | Status: AC
  Administered 2014-07-04: 10 mg via INTRAVENOUS
  Filled 2014-07-03: qty 1

## 2014-07-03 MED ORDER — HYDROMORPHONE HCL 1 MG/ML IJ SOLN
0.2500 mg | INTRAMUSCULAR | Status: DC | PRN
  Administered 2014-07-03 (×2): 0.5 mg via INTRAVENOUS

## 2014-07-03 MED ORDER — TRANEXAMIC ACID 100 MG/ML IV SOLN
1000.0000 mg | Freq: Once | INTRAVENOUS | Status: AC
  Administered 2014-07-03: 1000 mg via INTRAVENOUS
  Filled 2014-07-03: qty 10

## 2014-07-03 MED ORDER — MENTHOL 3 MG MT LOZG
1.0000 | LOZENGE | OROMUCOSAL | Status: DC | PRN
  Filled 2014-07-03: qty 9

## 2014-07-03 MED ORDER — HYDROMORPHONE HCL 1 MG/ML IJ SOLN
INTRAMUSCULAR | Status: AC
  Filled 2014-07-03: qty 1

## 2014-07-03 MED ORDER — METOCLOPRAMIDE HCL 10 MG PO TABS: 5.0000 mg | ORAL_TABLET | Freq: Three times a day (TID) | ORAL | Status: DC | PRN

## 2014-07-03 MED ORDER — SIMVASTATIN 40 MG PO TABS
40.0000 mg | ORAL_TABLET | Freq: Every day | ORAL | Status: DC
  Administered 2014-07-03: 40 mg via ORAL
  Filled 2014-07-03 (×2): qty 1

## 2014-07-03 MED ORDER — POLYETHYLENE GLYCOL 3350 17 G PO PACK
17.0000 g | PACK | Freq: Two times a day (BID) | ORAL | Status: DC
  Administered 2014-07-03 – 2014-07-04 (×2): 17 g via ORAL

## 2014-07-03 MED ORDER — PROPOFOL INFUSION 10 MG/ML OPTIME
INTRAVENOUS | Status: DC | PRN
  Administered 2014-07-03: 100 ug/kg/min via INTRAVENOUS

## 2014-07-03 MED ORDER — PANTOPRAZOLE SODIUM 40 MG PO TBEC
40.0000 mg | DELAYED_RELEASE_TABLET | Freq: Every day | ORAL | Status: DC
  Filled 2014-07-03: qty 1

## 2014-07-03 MED ORDER — PROMETHAZINE HCL 25 MG/ML IJ SOLN: 6.2500 mg | INTRAMUSCULAR | Status: DC | PRN

## 2014-07-03 MED ORDER — CHLORHEXIDINE GLUCONATE 4 % EX LIQD: 60.0000 mL | Freq: Once | CUTANEOUS | Status: DC

## 2014-07-03 MED ORDER — DEXAMETHASONE SODIUM PHOSPHATE 10 MG/ML IJ SOLN
INTRAMUSCULAR | Status: AC
  Filled 2014-07-03: qty 1

## 2014-07-03 MED ORDER — ALUM & MAG HYDROXIDE-SIMETH 200-200-20 MG/5ML PO SUSP: 30.0000 mL | ORAL | Status: DC | PRN

## 2014-07-03 MED ORDER — BUPIVACAINE HCL (PF) 0.5 % IJ SOLN
INTRAMUSCULAR | Status: AC
  Filled 2014-07-03: qty 30

## 2014-07-03 MED ORDER — ONDANSETRON HCL 4 MG PO TABS: 4.0000 mg | ORAL_TABLET | Freq: Four times a day (QID) | ORAL | Status: DC | PRN

## 2014-07-03 SURGICAL SUPPLY — 37 items
BAG ZIPLOCK 12X15 (MISCELLANEOUS) IMPLANT
CAPT HIP TOTAL 2 ×2 IMPLANT
COVER PERINEAL POST (MISCELLANEOUS) ×2 IMPLANT
DERMABOND ADVANCED (GAUZE/BANDAGES/DRESSINGS) ×1
DERMABOND ADVANCED .7 DNX12 (GAUZE/BANDAGES/DRESSINGS) ×1 IMPLANT
DRAPE C-ARM 42X120 X-RAY (DRAPES) ×2 IMPLANT
DRAPE STERI IOBAN 125X83 (DRAPES) ×2 IMPLANT
DRAPE U-SHAPE 47X51 STRL (DRAPES) ×6 IMPLANT
DRSG AQUACEL AG ADV 3.5X10 (GAUZE/BANDAGES/DRESSINGS) ×2 IMPLANT
DURAPREP 26ML APPLICATOR (WOUND CARE) ×2 IMPLANT
ELECT BLADE TIP CTD 4 INCH (ELECTRODE) ×2 IMPLANT
ELECT PENCIL ROCKER SW 15FT (MISCELLANEOUS) IMPLANT
ELECT REM PT RETURN 15FT ADLT (MISCELLANEOUS) IMPLANT
ELECT REM PT RETURN 9FT ADLT (ELECTROSURGICAL) ×2
ELECTRODE REM PT RTRN 9FT ADLT (ELECTROSURGICAL) ×1 IMPLANT
FACESHIELD WRAPAROUND (MASK) ×8 IMPLANT
GLOVE BIOGEL PI IND STRL 7.5 (GLOVE) ×1 IMPLANT
GLOVE BIOGEL PI IND STRL 8.5 (GLOVE) ×1 IMPLANT
GLOVE BIOGEL PI INDICATOR 7.5 (GLOVE) ×1
GLOVE BIOGEL PI INDICATOR 8.5 (GLOVE) ×1
GLOVE ECLIPSE 8.0 STRL XLNG CF (GLOVE) ×4 IMPLANT
GLOVE ORTHO TXT STRL SZ7.5 (GLOVE) ×2 IMPLANT
GOWN SPEC L3 XXLG W/TWL (GOWN DISPOSABLE) ×2 IMPLANT
GOWN STRL REUS W/TWL LRG LVL3 (GOWN DISPOSABLE) ×2 IMPLANT
HOLDER FOLEY CATH W/STRAP (MISCELLANEOUS) ×2 IMPLANT
KIT BASIN OR (CUSTOM PROCEDURE TRAY) ×2 IMPLANT
LIQUID BAND (GAUZE/BANDAGES/DRESSINGS) ×2 IMPLANT
PACK TOTAL JOINT (CUSTOM PROCEDURE TRAY) ×2 IMPLANT
SAW OSC TIP CART 19.5X105X1.3 (SAW) ×2 IMPLANT
SUT MNCRL AB 4-0 PS2 18 (SUTURE) ×2 IMPLANT
SUT VIC AB 1 CT1 36 (SUTURE) ×6 IMPLANT
SUT VIC AB 2-0 CT1 27 (SUTURE) ×2
SUT VIC AB 2-0 CT1 TAPERPNT 27 (SUTURE) ×2 IMPLANT
SUT VLOC 180 0 24IN GS25 (SUTURE) ×2 IMPLANT
TOWEL OR 17X26 10 PK STRL BLUE (TOWEL DISPOSABLE) ×2 IMPLANT
TOWEL OR NON WOVEN STRL DISP B (DISPOSABLE) IMPLANT
TRAY FOLEY CATH 16FRSI W/METER (SET/KITS/TRAYS/PACK) ×2 IMPLANT

## 2014-07-03 NOTE — Anesthesia Preprocedure Evaluation (Signed)
Anesthesia Evaluation  Patient identified by MRN, date of birth, ID band Patient awake    Reviewed: Allergy & Precautions, H&P , NPO status , Patient's Chart, lab work & pertinent test results  Airway Mallampati: I  TM Distance: >3 FB Neck ROM: Full    Dental  (+) Teeth Intact, Dental Advisory Given   Pulmonary former smoker,  breath sounds clear to auscultation        Cardiovascular hypertension, Rhythm:Regular Rate:Normal     Neuro/Psych negative neurological ROS  negative psych ROS   GI/Hepatic Neg liver ROS, GERD-  Medicated and Controlled,  Endo/Other  negative endocrine ROS  Renal/GU negative Renal ROS     Musculoskeletal  (+) Arthritis -,   Abdominal   Peds  Hematology negative hematology ROS (+)   Anesthesia Other Findings   Reproductive/Obstetrics                             Anesthesia Physical  Anesthesia Plan  ASA: II  Anesthesia Plan:    Post-op Pain Management:    Induction:   Airway Management Planned:   Additional Equipment:   Intra-op Plan:   Post-operative Plan:   Informed Consent: I have reviewed the patients History and Physical, chart, labs and discussed the procedure including the risks, benefits and alternatives for the proposed anesthesia with the patient or authorized representative who has indicated his/her understanding and acceptance.   Dental advisory given  Plan Discussed with: CRNA, Anesthesiologist and Surgeon  Anesthesia Plan Comments:         Anesthesia Quick Evaluation

## 2014-07-03 NOTE — Progress Notes (Signed)
[facility] is providing the following services: Patient declined rw and commode - has both at home.   If patient discharges after hours, please call [phone].   Linward Headland 07/03/2014, 4:44 PM

## 2014-07-03 NOTE — Op Note (Signed)
NAME:  John Benton NO.: [id number]      MEDICAL RECORD NO.: [id number]      FACILITY:  [facility]      PHYSICIAN:  Paralee Cancel D  DATE OF BIRTH:  08/24/1960     DATE OF PROCEDURE:  07/03/2014                                 OPERATIVE REPORT         PREOPERATIVE DIAGNOSIS: Left  hip osteoarthritis.      POSTOPERATIVE DIAGNOSIS:  Left hip osteoarthritis.      PROCEDURE:  Left total hip replacement through an anterior approach   utilizing DePuy THR system, component size 87mm pinnacle cup, a size 36+4 neutral   Altrex liner, a size 5standard Tri Lock stem with a 36+1.5 delta ceramic   ball.      SURGEON:  Pietro Cassis. Alvan Dame, M.D.      ASSISTANT:  Nehemiah Massed, PA-C     ANESTHESIA:  Spinal.      SPECIMENS:  None.      COMPLICATIONS:  None.      BLOOD LOSS:  500 cc     DRAINS:  None.      INDICATION OF THE PROCEDURE:  John Benton is a 53 y.o. male who had   presented to office for evaluation of left hip pain.  Radiographs revealed   progressive degenerative changes with bone-on-bone   articulation to the  hip joint.  The patient had painful limited range of   motion significantly affecting their overall quality of life.  The patient was failing to    respond to conservative measures, and at this point was ready   to proceed with more definitive measures.  The patient has noted progressive   degenerative changes in his hip, progressive problems and dysfunction   with regarding the hip prior to surgery.  Consent was obtained for   benefit of pain relief.  Specific risk of infection, DVT, component   failure, dislocation, need for revision surgery, as well discussion of   the anterior versus posterior approach were reviewed.  Consent was   obtained for benefit of anterior pain relief through an anterior   approach.      PROCEDURE IN DETAIL:  The patient was brought to operative theater.   Once adequate anesthesia, preoperative  antibiotics, 2gm of Ancef administered.   The patient was positioned supine on the OSI Hanna table.  Once adequate   padding of boney process was carried out, we had predraped out the hip, and  used fluoroscopy to confirm orientation of the pelvis and position.      The left hip was then prepped and draped from proximal iliac crest to   mid thigh with shower curtain technique.      Time-out was performed identifying the patient, planned procedure, and   extremity.     An incision was then made 2 cm distal and lateral to the   anterior superior iliac spine extending over the orientation of the   tensor fascia lata muscle and sharp dissection was carried down to the   fascia of the muscle and protractor placed in the soft tissues.      The fascia was then incised.  The muscle belly was identified and swept  laterally and retractor placed along the superior neck.  Following   cauterization of the circumflex vessels and removing some pericapsular   fat, a second cobra retractor was placed on the inferior neck.  A third   retractor was placed on the anterior acetabulum after elevating the   anterior rectus.  A L-capsulotomy was along the line of the   superior neck to the trochanteric fossa, then extended proximally and   distally.  Tag sutures were placed and the retractors were then placed   intracapsular.  We then identified the trochanteric fossa and   orientation of my neck cut, confirmed this radiographically   and then made a neck osteotomy with the femur on traction.  The femoral   head was removed without difficulty or complication.  Traction was let   off and retractors were placed posterior and anterior around the   acetabulum.      The labrum and foveal tissue were debrided.  I began reaming with a 51mm   reamer and reamed up to 45mm reamer with good bony bed preparation and a 61mm   cup was chosen.  The final 67mm Pinnacle cup was then impacted under fluoroscopy  to confirm  the depth of penetration and orientation with respect to   abduction.  A screw was placed followed by the hole eliminator.  The final   36+4 neutral Altrex liner was impacted with good visualized rim fit.  The cup was positioned anatomically within the acetabular portion of the pelvis.      At this point, the femur was rolled at 80 degrees.  Further capsule was   released off the inferior aspect of the femoral neck.  I then   released the superior capsule proximally.  The hook was placed laterally   along the femur and elevated manually and held in position with the bed   hook.  The leg was then extended and adducted with the leg rolled to 100   degrees of external rotation.  Once the proximal femur was fully   exposed, I used a box osteotome to set orientation.  I then began   broaching with the starting chili pepper broach and passed this by hand and then broached up to 5.  With the 5 broach in place I chose a standard offset neck and did a trial reduction.  The offset was appropriate, leg lengths   appeared to be equal, confirmed radiographically.   Given these findings, I went ahead and dislocated the hip, repositioned all   retractors and positioned the right hip in the extended and abducted position.  The final 5 standard Tri Lock stem was   chosen and it was impacted down to the level of neck cut.  Based on this   and the trial reduction, a 36+1.5 delta ceramic ball was chosen and   impacted onto a clean and dry trunnion, and the hip was reduced.  The   hip had been irrigated throughout the case again at this point.  I did   reapproximate the superior capsular leaflet to the anterior leaflet   using #1 Vicryl.  The fascia of the   tensor fascia lata muscle was then reapproximated using #1 Vicryl and #0 V-lock sutures.  The   remaining wound was closed with 2-0 Vicryl and running 4-0 Monocryl.   The hip was cleaned, dried, and dressed sterilely using Dermabond and   Aquacel dressing.   He was then brought   to recovery room  in stable condition tolerating the procedure well.    Nehemiah Massed, PA-C was present for the entirety of the case involved from   preoperative positioning, perioperative retractor management, general   facilitation of the case, as well as primary wound closure as assistant.            Pietro Cassis Alvan Dame, M.D.        07/03/2014 12:34 PM

## 2014-07-03 NOTE — Interval H&P Note (Signed)
History and Physical Interval Note:  07/03/2014 9:58 AM  John Benton  has presented today for surgery, with the diagnosis of LEFT HIP OA  The various methods of treatment have been discussed with the patient and family. After consideration of risks, benefits and other options for treatment, the patient has consented to  Procedure(s): LEFT TOTAL HIP ARTHROPLASTY ANTERIOR APPROACH (Left) as a surgical intervention .  The patient's history has been reviewed, patient examined, no change in status, stable for surgery.  I have reviewed the patient's chart and labs.  Questions were answered to the patient's satisfaction.     Mauri Pole

## 2014-07-03 NOTE — Transfer of Care (Signed)
Immediate Anesthesia Transfer of Care Note  Patient: John Benton  Procedure(s) Performed: Procedure(s) (LRB): LEFT TOTAL HIP ARTHROPLASTY ANTERIOR APPROACH (Left)  Patient Location: PACU  Anesthesia Type: Spinal  Level of Consciousness: sedated, patient cooperative and responds to stimulation  Airway & Oxygen Therapy: Patient Spontanous Breathing and Patient connected to face mask oxgen  Post-op Assessment: Report given to PACU RN and Post -op Vital signs reviewed and stable  Post vital signs: Reviewed and stable  Complications: No apparent anesthesia complications

## 2014-07-03 NOTE — Progress Notes (Signed)
Utilization review completed.  

## 2014-07-03 NOTE — Anesthesia Procedure Notes (Signed)
Spinal Patient location during procedure: OR Start time: 07/03/2014 11:02 AM End time: 07/03/2014 11:08 AM Staffing Resident/CRNA: Anne Fu Performed by: resident/CRNA  Preanesthetic Checklist Completed: patient identified, site marked, surgical consent, pre-op evaluation, timeout performed, IV checked, risks and benefits discussed and monitors and equipment checked Spinal Block Patient position: sitting Prep: Betadine Patient monitoring: heart rate, continuous pulse ox and blood pressure Approach: right paramedian Location: L2-3 Injection technique: single-shot Needle Needle type: Whitacre  Needle gauge: 25 G Needle length: 9 cm Assessment Sensory level: T6 Additional Notes Expiration date of kit checked and confirmed. Patient tolerated procedure well, without complications. X 1 attempt with clear CSF return, noted loss of motor and sensory on exam post injection.

## 2014-07-03 NOTE — Plan of Care (Signed)
Problem: Consults Goal: Diagnosis- Total Joint Replacement Primary Total Hip     

## 2014-07-04 LAB — CBC
HCT: 42.5 % (ref 39.0–52.0)
HEMOGLOBIN: 13.9 g/dL (ref 13.0–17.0)
MCH: 31.4 pg (ref 26.0–34.0)
MCHC: 32.7 g/dL (ref 30.0–36.0)
MCV: 95.9 fL (ref 78.0–100.0)
Platelets: 240 10*3/uL (ref 150–400)
RBC: 4.43 MIL/uL (ref 4.22–5.81)
RDW: 13.4 % (ref 11.5–15.5)
WBC: 14 10*3/uL — ABNORMAL HIGH (ref 4.0–10.5)

## 2014-07-04 LAB — BASIC METABOLIC PANEL
Anion gap: 6 (ref 5–15)
BUN: 18 mg/dL (ref 6–23)
CO2: 26 mmol/L (ref 19–32)
Calcium: 8.8 mg/dL (ref 8.4–10.5)
Chloride: 105 mEq/L (ref 96–112)
Creatinine, Ser: 0.89 mg/dL (ref 0.50–1.35)
GFR calc Af Amer: >90 mL/min (ref >90)
Glucose, Bld: 152 mg/dL — ABNORMAL HIGH (ref 70–99)
Potassium: 4.4 mmol/L (ref 3.5–5.1)
SODIUM: 137 mmol/L (ref 135–145)

## 2014-07-04 MED ORDER — ACETAMINOPHEN 500 MG PO TABS: 1000.0000 mg | ORAL_TABLET | Freq: Three times a day (TID) | ORAL | Status: DC

## 2014-07-04 MED ORDER — METHOCARBAMOL 500 MG PO TABS: 500.0000 mg | ORAL_TABLET | Freq: Four times a day (QID) | ORAL | Status: DC | PRN

## 2014-07-04 MED ORDER — FERROUS SULFATE 325 (65 FE) MG PO TABS: 325.0000 mg | ORAL_TABLET | Freq: Three times a day (TID) | ORAL | Status: DC

## 2014-07-04 MED ORDER — DSS 100 MG PO CAPS: 100.0000 mg | ORAL_CAPSULE | Freq: Two times a day (BID) | ORAL | Status: DC

## 2014-07-04 MED ORDER — POLYETHYLENE GLYCOL 3350 17 G PO PACK: 17.0000 g | PACK | Freq: Two times a day (BID) | ORAL | Status: DC

## 2014-07-04 MED ORDER — ASPIRIN 325 MG PO TBEC: 325.0000 mg | DELAYED_RELEASE_TABLET | Freq: Two times a day (BID) | ORAL | Status: AC

## 2014-07-04 MED ORDER — OXYCODONE HCL 5 MG PO TABS
5.0000 mg | ORAL_TABLET | ORAL | Status: DC
  Administered 2014-07-04 (×2): 10 mg via ORAL
  Filled 2014-07-04 (×2): qty 2

## 2014-07-04 MED ORDER — OXYCODONE HCL 5 MG PO TABS: 5.0000 mg | ORAL_TABLET | ORAL | Status: DC | PRN

## 2014-07-04 NOTE — Progress Notes (Signed)
Discharge summary sent to payer through [facility]  

## 2014-07-04 NOTE — Progress Notes (Signed)
     Subjective: 1 Day Post-Op Procedure(s) (LRB): LEFT TOTAL HIP ARTHROPLASTY ANTERIOR APPROACH (Left)   Patient reports pain as moderate, pain not fully controlled. No events throughout the night. Ready to be discharged home if his pain is better controlled.   Objective:   VITALS:   Filed Vitals:   07/04/14  BP: 125/78  Pulse: 72  Temp: 98.2 F (36.8 C)  Resp: 16    Dorsiflexion/Plantar flexion intact Incision: dressing C/D/I No cellulitis present Compartment soft  LABS  Recent Labs  07/04/14 0410  HGB 13.9  HCT 42.5  WBC 14.0*  PLT 240     Recent Labs  07/04/14 0410  NA 137  K 4.4  BUN 18  CREATININE 0.89  GLUCOSE 152*     Assessment/Plan: 1 Day Post-Op Procedure(s) (LRB): LEFT TOTAL HIP ARTHROPLASTY ANTERIOR APPROACH (Left) Changed from Norco to Oxycodone. Foley cath d/c'ed Advance diet Up with therapy D/C IV fluids Discharge home with home health  Follow up in 2 weeks at [facility] Follow up with OLIN,Kezia Benevides D in 2 weeks.  Contact information:  [facility] [address] [place] [phone]    Obese (BMI 30-39.9) Estimated body mass index is 31.53 kg/(m^2) as calculated from the following:   Height as of this encounter: 5' 10.5" (1.791 m).   Weight as of this encounter: 101.152 kg (223 lb). Patient also counseled that weight may inhibit the healing process Patient counseled that losing weight will help with future health issues        West Pugh. Izaak Sahr   PAC  07/04/2014, 7:52 AM

## 2014-07-04 NOTE — Evaluation (Signed)
Occupational Therapy Evaluation Patient Details Name: John Benton MRN: [id number] DOB: 07-Jan-1961 Today's Date: 07/04/2014    History of Present Illness s/p L DA THA   Clinical Impression   This 53 year old man was admitted for the above surgery. All education was completed. No further OT is needed at this time.    Follow Up Recommendations  No OT follow up    Equipment Recommendations  None recommended by OT (has 3:1)    Recommendations for Other Services       Precautions / Restrictions Precautions Precautions: Fall Restrictions Weight Bearing Restrictions: No      Mobility Bed Mobility                  Transfers Overall transfer level: Needs assistance Equipment used: Rolling walker (2 wheeled) Transfers: Sit to/from Stand Sit to Stand: Supervision         General transfer comment: cue for LE/UE placement    Balance                                            ADL Overall ADL's : Needs assistance/impaired             Lower Body Bathing: Minimal assistance;Sit to/from stand       Lower Body Dressing: Moderate assistance;Sit to/from stand   Toilet Transfer: Ambulation;Min guard (to chair)       Tub/ Shower Transfer: Walk-in shower;Min guard     General ADL Comments: wife will assist with adls as needed.  Pt does not have AE.  Educated that 3:1 can be used in shower stall--they do have built in seats.  Recommended wiping legs off if used in shower     Vision                     Perception     Praxis      Pertinent Vitals/Pain Pain Assessment: 0-10 Pain Score: 4  Pain Location: incision L hip Pain Descriptors / Indicators: Sore Pain Intervention(s): Limited activity within patient's tolerance;Monitored during session;Repositioned;Ice applied     Hand Dominance     Extremity/Trunk Assessment Upper Extremity Assessment Upper Extremity Assessment: Overall WFL for tasks assessed            Communication Communication Communication: No difficulties   Cognition Arousal/Alertness: Awake/alert Behavior During Therapy: WFL for tasks assessed/performed Overall Cognitive Status: Within Functional Limits for tasks assessed                     General Comments       Exercises       Shoulder Instructions      Home Living Family/patient expects to be discharged to:: Private residence Living Arrangements: Spouse/significant other                 Bathroom Shower/Tub: Occupational psychologist: Standard     Home Equipment: Bedside commode          Prior Functioning/Environment Level of Independence: Independent             OT Diagnosis: Acute pain   OT Problem List:     OT Treatment/Interventions:      OT Goals(Current goals can be found in the care plan section)    OT Frequency:     Barriers to D/C:  Co-evaluation              End of Session    Activity Tolerance: Patient tolerated treatment well Patient left: in chair;with call bell/phone within reach;with family/visitor present   Time: 4431-5400 OT Time Calculation (min): 19 min Charges:  OT General Charges $OT Visit: 1 Procedure OT Evaluation $Initial OT Evaluation Tier I: 1 Procedure OT Treatments $Self Care/Home Management : 8-22 mins G-Codes:    John Benton 07/05/2014, 9:27 AM  [name], OTR/L [phone] Jul 05, 2014

## 2014-07-04 NOTE — Care Management Note (Signed)
  Page 2 of 2   07/04/2014     11:54:58 AM CARE MANAGEMENT NOTE 07/04/2014  Patient:  John Benton, John Benton   Account Number:  [id number]  Date Initiated:  07/04/2014  Documentation initiated by:  [facility]  Subjective/Objective Assessment:   adm: LEFT TOTAL HIP ARTHROPLASTY ANTERIOR APPROACH (Left)     Action/Plan:   discahrge planning   Anticipated DC Date:  07/04/2014   Anticipated DC Plan:  [place]  CM consult      [facility] Choice  HOME HEALTH   Choice offered to / List presented to:  C-1 Patient   DME arranged  NA      DME agency  NA     [place] arranged  HH-2 PT      [place].   Status of service:  Completed, signed off Medicare Important Message given?   (If response is "NO", the following Medicare IM given date fields will be blank) Date Medicare IM given:   Medicare IM given by:   Date Additional Medicare IM given:   Additional Medicare IM given by:    Discharge Disposition:  [place]  Per UR Regulation:    If discussed at Long Length of Stay Meetings, dates discussed:    Comments:  07/04/14 09:30 Cm met with pt in room to offer choice of home health agency.  Pt chooses [facility] to render HHPT.  Pt has all his DME.  address and contact information verified with pt.  Referral called to [facility] rep, Stephanie.  No other Cm needs were ocmmunicated.  Mariane Masters, BSN, [place].

## 2014-07-04 NOTE — Progress Notes (Signed)
Physical Therapy Treatment Patient Details Name: John Benton MRN: [id number] DOB: March 27, 1961 Today's Date: 07/04/2014    History of Present Illness 53 yo male s/p L THA-direct anterior 07/03/14.    PT Comments    Continuing to perform well with mobility. All education completed. Ready to d/c from Pt standpoint.  Follow Up Recommendations  Home health PT     Equipment Recommendations  None recommended by PT    Recommendations for Other Services       Precautions / Restrictions Precautions Precautions: None Restrictions Weight Bearing Restrictions: No LLE Weight Bearing: Weight bearing as tolerated    Mobility  Bed Mobility               General bed mobility comments: pt OOB in recliner  Transfers Overall transfer level: Needs assistance   Transfers: Sit to/from Stand Sit to Stand: Supervision            Ambulation/Gait Ambulation/Gait assistance: Supervision Ambulation Distance (Feet): 105 Feet Assistive device: Rolling walker (2 wheeled) Gait Pattern/deviations: Step-through pattern;Decreased stride length;Decreased step length - left     General Gait Details: slow gait speed.    Stairs Stairs: Yes   Stair Management: Backwards;With walker Number of Stairs: 1 General stair comments: VCs safety, technique, sequence. close guard for safety  Wheelchair Mobility    Modified Rankin (Stroke Patients Only)       Balance                                    Cognition Arousal/Alertness: Awake/alert Behavior During Therapy: WFL for tasks assessed/performed Overall Cognitive Status: Within Functional Limits for tasks assessed                      Exercises      General Comments        Pertinent Vitals/Pain Pain Assessment: 0-10 Pain Score: 3  Pain Location: L hip anterior thight Pain Descriptors / Indicators: Sore Pain Intervention(s): Monitored during session    Home Living                       Prior Function            PT Goals (current goals can now be found in the care plan section) Progress towards PT goals: Progressing toward goals    Frequency  7X/week    PT Plan Current plan remains appropriate    Co-evaluation             End of Session   Activity Tolerance: Patient tolerated treatment well Patient left: in chair;with call bell/phone within reach;with family/visitor present     Time: 4481-8563 PT Time Calculation (min) (ACUTE ONLY): 14 min  Charges:  $Gait Training: 8-22 mins                    G Codes:      [name], MPT Pager: [phone]

## 2014-07-04 NOTE — Evaluation (Signed)
Physical Therapy Evaluation Patient Details Name: John Benton MRN: [id number] DOB: 07/29/1960 Today's Date: 07/04/2014   History of Present Illness  53 yo male s/p L THA-direct anterior 07/03/14.  Clinical Impression  On eval, pt was Min guard assist for mobility-able to ambulate ~90 feet with RW. Increased pain with ambulation, exercises. Made RN aware of need for pain meds. Plan is for d/c later today. Will return for a 2nd session.     Follow Up Recommendations Home health PT    Equipment Recommendations  None recommended by PT    Recommendations for Other Services       Precautions / Restrictions Precautions Precautions: None Restrictions Weight Bearing Restrictions: No LLE Weight Bearing: Weight bearing as tolerated      Mobility  Bed Mobility               General bed mobility comments: pt OOB in recliner  Transfers Overall transfer level: Needs assistance Equipment used: Rolling walker (2 wheeled) Transfers: Sit to/from Stand Sit to Stand: Supervision         General transfer comment: cue for LE/UE placement  Ambulation/Gait Ambulation/Gait assistance: Min guard Ambulation Distance (Feet): 90 Feet Assistive device: Rolling walker (2 wheeled) Gait Pattern/deviations: Step-to pattern;Step-through pattern;Antalgic;Decreased step length - left;Decreased stride length     General Gait Details: slow gait speed. close guard for safety  Stairs            Wheelchair Mobility    Modified Rankin (Stroke Patients Only)       Balance                                             Pertinent Vitals/Pain Pain Assessment: 0-10 Pain Score: 8  Pain Location: L hip incision Pain Descriptors / Indicators: Sore Pain Intervention(s): Monitored during session;Ice applied;Patient requesting pain meds-RN notified    Home Living Family/patient expects to be discharged to:: Private residence Living Arrangements: Spouse/significant  other   Type of Home: House Home Access: Stairs to enter Entrance Stairs-Rails: None Entrance Stairs-Number of Steps: 1 Home Layout: One level Home Equipment: Environmental consultant - 2 wheels      Prior Function Level of Independence: Independent               Hand Dominance        Extremity/Trunk Assessment   Upper Extremity Assessment: Defer to OT evaluation           Lower Extremity Assessment: LLE deficits/detail   LLE Deficits / Details: hip flex at least 2/5, knee ext at least 3/5, hip abd/add at least 2/5  Cervical / Trunk Assessment: Normal  Communication   Communication: No difficulties  Cognition Arousal/Alertness: Awake/alert Behavior During Therapy: WFL for tasks assessed/performed Overall Cognitive Status: Within Functional Limits for tasks assessed                      General Comments      Exercises Total Joint Exercises Ankle Circles/Pumps: AROM;Both;10 reps;Seated Heel Slides: AAROM;Left;10 reps;Seated Hip ABduction/ADduction: AAROM;Left;10 reps;Seated Long Arc Quad: AROM;Left;10 reps;Seated      Assessment/Plan    PT Assessment Patient needs continued PT services  PT Diagnosis Difficulty walking;Acute pain   PT Problem List Decreased strength;Decreased range of motion;Decreased activity tolerance;Decreased balance;Decreased mobility;Decreased knowledge of use of DME;Pain  PT Treatment Interventions DME instruction;Gait training;Functional mobility training;Therapeutic activities;Therapeutic exercise;Patient/family  education;Balance training   PT Goals (Current goals can be found in the Care Plan section) Acute Rehab PT Goals Patient Stated Goal: less pain. home today PT Goal Formulation: With patient Time For Goal Achievement: 07/11/14 Potential to Achieve Goals: Good    Frequency 7X/week   Barriers to discharge        Co-evaluation               End of Session   Activity Tolerance: Patient limited by pain Patient left:  in chair;with call bell/phone within reach           Time: 0907-0923 PT Time Calculation (min) (ACUTE ONLY): 16 min   Charges:   PT Evaluation $Initial PT Evaluation Tier I: 1 Procedure PT Treatments $Gait Training: 8-22 mins   PT G Codes:          John Benton, MPT Pager: [phone]

## 2014-07-04 NOTE — Anesthesia Postprocedure Evaluation (Signed)
  Anesthesia Post-op Note  Patient: John Benton  Procedure(s) Performed: Procedure(s) (LRB): LEFT TOTAL HIP ARTHROPLASTY ANTERIOR APPROACH (Left)  Patient Location: PACU  Anesthesia Type: Spinal  Level of Consciousness: awake and alert   Airway and Oxygen Therapy: Patient Spontanous Breathing  Post-op Pain: mild  Post-op Assessment: Post-op Vital signs reviewed, Patient's Cardiovascular Status Stable, Respiratory Function Stable, Patent Airway and No signs of Nausea or vomiting  Last Vitals:  Filed Vitals:   07/04/14 1000  BP: 146/92  Pulse: 62  Temp: 36.9 C  Resp: 18    Post-op Vital Signs: stable   Complications: No apparent anesthesia complications

## 2014-07-12 NOTE — Discharge Summary (Signed)
Physician Discharge Summary  Patient ID: John Benton MRN: [id number] DOB/AGE: 1961-03-29 53 y.o.  Admit date: 07/03/2014  Discharge date: 07/04/2014   Procedures:  Procedure(s) (LRB): LEFT TOTAL HIP ARTHROPLASTY ANTERIOR APPROACH (Left)  Attending Physician:  Dr. Paralee Cancel   Admission Diagnoses:   Left hip primary OA / pain  Discharge Diagnoses:  Principal Problem:   S/P left THA, AA  Past Medical History  Diagnosis Date  . No pertinent past medical history   . Hyperlipemia   . Arthritis   . GERD (gastroesophageal reflux disease)   . Wears glasses   . Cancer     SKIN CANCERS REMOVED  . Hypertension     NEW DIAGNOSIS 05/28/14 -PT PUT ON METOPROLOL AND IS TO FOLLOW UP WITH HIS MEDCIAL DOCTOR TODAY  06/26/14    HPI: John Benton, 53 y.o. male, has a history of pain and functional disability in the left hip(s) due to arthritis and patient has failed non-surgical conservative treatments for greater than 12 weeks to include NSAID's and/or analgesics, corticosteriod injections and activity modification. Onset of symptoms was gradual starting 2+ years ago with gradually worsening course since that time.The patient noted no past surgery on the left hip(s). Patient currently rates pain in the left hip at 10 out of 10 with activity. Patient has night pain, worsening of pain with activity and weight bearing, trendelenberg gait, pain that interfers with activities of daily living and pain with passive range of motion. Patient has evidence of periarticular osteophytes and joint space narrowing by imaging studies. This condition presents safety issues increasing the risk of falls. There is no current active infection. Risks, benefits and expectations were discussed with the patient. Risks including but not limited to the risk of anesthesia, blood clots, nerve damage, blood vessel damage, failure of the prosthesis, infection and up to and including death. Patient understand the  risks, benefits and expectations and wishes to proceed with surgery.   PCP: Abigail Miyamoto, MD   Discharged Condition: good  Hospital Course:  Patient underwent the above stated procedure on 07/03/2014. Patient tolerated the procedure well and brought to the recovery room in good condition and subsequently to the floor.  POD #1 BP: 125/78 ; Pulse: 72 ; Temp: 98.2 F (36.8 C) ; Resp: 16 Patient reports pain as moderate, pain not fully controlled. No events throughout the night. Ready to be discharged home. Dorsiflexion/plantar flexion intact, incision: dressing C/D/I, no cellulitis present and compartment soft.    LABS  Basename    HGB  13.9  HCT  42.5    Discharge Exam: General appearance: alert, cooperative and no distress Extremities: Homans sign is negative, no sign of DVT, no edema, redness or tenderness in the calves or thighs and no ulcers, gangrene or trophic changes  Disposition: Home with follow up in 2 weeks   Follow-up Information    Follow up with Mauri Pole, MD. Schedule an appointment as soon as possible for a visit in 2 weeks.   Specialty:  Orthopedic Surgery   Contact information:   [address] [place] [address] [phone]       Follow up with [facility].   Why:  home health physical therapy   Contact information:   [address] [place] [address] [phone]       Discharge Instructions    Call MD / Call 911    Complete by:  As directed   If you experience  chest pain or shortness of breath, CALL 911 and be transported to the hospital emergency room.  If you develope a fever above 101 F, pus (white drainage) or increased drainage or redness at the wound, or calf pain, call your surgeon's office.     Change dressing    Complete by:  As directed   Maintain surgical dressing until follow up in the clinic. If the edges start to pull up, may reinforce with tape. If the dressing is no longer  working, may remove and cover with gauze and tape, but must keep the area dry and clean.  Call with any questions or concerns.     Constipation Prevention    Complete by:  As directed   Drink plenty of fluids.  Prune juice may be helpful.  You may use a stool softener, such as Colace (over the counter) 100 mg twice a day.  Use MiraLax (over the counter) for constipation as needed.     Diet - low sodium heart healthy    Complete by:  As directed      Discharge instructions    Complete by:  As directed   Maintain surgical dressing until follow up in the clinic. If the edges start to pull up, may reinforce with tape. If the dressing is no longer working, may remove and cover with gauze and tape, but must keep the area dry and clean.  Follow up in 2 weeks at [facility]. Call with any questions or concerns.     Increase activity slowly as tolerated    Complete by:  As directed      TED hose    Complete by:  As directed   Use stockings (TED hose) for 2 weeks on both leg(s).  You may remove them at night for sleeping.     Weight bearing as tolerated    Complete by:  As directed   Laterality:  left  Extremity:  Lower             Medication List    STOP taking these medications        ibuprofen 200 MG tablet  Commonly known as:  ADVIL,MOTRIN      TAKE these medications        acetaminophen 500 MG tablet  Commonly known as:  TYLENOL  Take 2 tablets (1,000 mg total) by mouth every 8 (eight) hours.     aspirin 325 MG EC tablet  Take 1 tablet (325 mg total) by mouth 2 (two) times daily.     CALCIUM PO  Take 1,000 mg by mouth at bedtime.     CO Q 10 PO  Take 500 mg by mouth at bedtime.     DSS 100 MG Caps  Take 100 mg by mouth 2 (two) times daily.  Notes to Patient:  Stool softner- Colace     ferrous sulfate 325 (65 FE) MG tablet  Take 1 tablet (325 mg total) by mouth 3 (three) times daily after meals.     fish oil-omega-3 fatty acids 1000 MG capsule  Take 1 g by  mouth at bedtime.     methocarbamol 500 MG tablet  Commonly known as:  ROBAXIN  Take 1 tablet (500 mg total) by mouth every 6 (six) hours as needed for muscle spasms.  Notes to Patient:  Muscle Relaxer     metoprolol succinate 50 MG 24 hr tablet  Commonly known as:  TOPROL-XL  Take 100 mg by mouth every evening. Take  with or immediately following a meal.     multivitamin with minerals tablet  Take 1 tablet by mouth at bedtime.     omeprazole 20 MG capsule  Commonly known as:  PRILOSEC  Take 20 mg by mouth every morning.     oxyCODONE 5 MG immediate release tablet  Commonly known as:  Oxy IR/ROXICODONE  Take 1-3 tablets (5-15 mg total) by mouth every 4 (four) hours as needed for moderate pain or severe pain.     polyethylene glycol packet  Commonly known as:  MIRALAX / GLYCOLAX  Take 17 g by mouth 2 (two) times daily.     simvastatin 40 MG tablet  Commonly known as:  ZOCOR  Take 40 mg by mouth at bedtime.         Signed: [place] Pugh. Kim Oki   PA-C  07/12/2014, 12:28 PM

## 2015-01-24 ENCOUNTER — Emergency Department (HOSPITAL_BASED_OUTPATIENT_CLINIC_OR_DEPARTMENT_OTHER)
Admission: EM | Admit: 2015-01-24 | Discharge: 2015-01-24 | Disposition: A | Discharge status: Home / Self Care | Payer: BLUE CROSS/BLUE SHIELD | Attending: Emergency Medicine | Admitting: Emergency Medicine

## 2015-01-24 ENCOUNTER — Encounter (HOSPITAL_BASED_OUTPATIENT_CLINIC_OR_DEPARTMENT_OTHER): Payer: Self-pay | Admitting: *Deleted

## 2015-01-24 ENCOUNTER — Emergency Department (HOSPITAL_BASED_OUTPATIENT_CLINIC_OR_DEPARTMENT_OTHER): Payer: BLUE CROSS/BLUE SHIELD

## 2015-01-24 DIAGNOSIS — Z87891 Personal history of nicotine dependence: Secondary | ICD-10-CM | POA: Diagnosis not present

## 2015-01-24 DIAGNOSIS — N451 Epididymitis: Secondary | ICD-10-CM | POA: Diagnosis not present

## 2015-01-24 DIAGNOSIS — Z880 Allergy status to penicillin: Secondary | ICD-10-CM | POA: Diagnosis not present

## 2015-01-24 DIAGNOSIS — N50819 Testicular pain, unspecified: Secondary | ICD-10-CM

## 2015-01-24 DIAGNOSIS — Z85828 Personal history of other malignant neoplasm of skin: Secondary | ICD-10-CM | POA: Insufficient documentation

## 2015-01-24 DIAGNOSIS — E785 Hyperlipidemia, unspecified: Secondary | ICD-10-CM | POA: Diagnosis not present

## 2015-01-24 DIAGNOSIS — I10 Essential (primary) hypertension: Secondary | ICD-10-CM | POA: Insufficient documentation

## 2015-01-24 DIAGNOSIS — K219 Gastro-esophageal reflux disease without esophagitis: Secondary | ICD-10-CM | POA: Insufficient documentation

## 2015-01-24 DIAGNOSIS — N508 Other specified disorders of male genital organs: Secondary | ICD-10-CM | POA: Diagnosis present

## 2015-01-24 DIAGNOSIS — Z79899 Other long term (current) drug therapy: Secondary | ICD-10-CM | POA: Diagnosis not present

## 2015-01-24 DIAGNOSIS — M1990 Unspecified osteoarthritis, unspecified site: Secondary | ICD-10-CM | POA: Diagnosis not present

## 2015-01-24 DIAGNOSIS — N50811 Right testicular pain: Secondary | ICD-10-CM

## 2015-01-24 LAB — URINALYSIS, ROUTINE W REFLEX MICROSCOPIC
BILIRUBIN URINE: NEGATIVE
GLUCOSE, UA: NEGATIVE mg/dL
Hgb urine dipstick: NEGATIVE
Ketones, ur: NEGATIVE mg/dL
Leukocytes, UA: NEGATIVE
Nitrite: NEGATIVE
PH: 5 (ref 5.0–8.0)
PROTEIN: NEGATIVE mg/dL
Specific Gravity, Urine: 1.024 (ref 1.005–1.030)
UROBILINOGEN UA: 0.2 mg/dL (ref 0.0–1.0)

## 2015-01-24 MED ORDER — HYDROMORPHONE HCL 1 MG/ML IJ SOLN
1.0000 mg | Freq: Once | INTRAMUSCULAR | Status: AC
  Administered 2015-01-24: 1 mg via INTRAVENOUS
  Filled 2015-01-24: qty 1

## 2015-01-24 MED ORDER — CIPROFLOXACIN HCL 500 MG PO TABS: 500.0000 mg | ORAL_TABLET | Freq: Two times a day (BID) | ORAL | Status: DC

## 2015-01-24 MED ORDER — OXYCODONE-ACETAMINOPHEN 5-325 MG PO TABS: 1.0000 | ORAL_TABLET | Freq: Four times a day (QID) | ORAL | Status: DC | PRN

## 2015-01-24 MED ORDER — OXYCODONE-ACETAMINOPHEN 5-325 MG PO TABS
1.0000 | ORAL_TABLET | Freq: Once | ORAL | Status: AC
  Administered 2015-01-24: 1 via ORAL
  Filled 2015-01-24: qty 1

## 2015-01-24 MED ORDER — ONDANSETRON HCL 4 MG/2ML IJ SOLN
4.0000 mg | Freq: Once | INTRAMUSCULAR | Status: AC
  Administered 2015-01-24: 4 mg via INTRAVENOUS
  Filled 2015-01-24: qty 2

## 2015-01-24 MED ORDER — CIPROFLOXACIN HCL 500 MG PO TABS
500.0000 mg | ORAL_TABLET | Freq: Once | ORAL | Status: AC
  Administered 2015-01-24: 500 mg via ORAL
  Filled 2015-01-24: qty 1

## 2015-01-24 NOTE — ED Notes (Signed)
Patient transported to Ultrasound 

## 2015-01-24 NOTE — ED Notes (Signed)
Testicular pain since lunch today. Swelling.

## 2015-01-24 NOTE — ED Provider Notes (Signed)
CSN: [id number]     Arrival date & time 01/24/15  1856 History   First MD Initiated Contact with Patient 01/24/15 1908     Chief Complaint  Patient presents with  . Testicle Pain     (Consider location/radiation/quality/duration/timing/severity/associated sxs/prior Treatment) HPI Comments: Patient presents with acute onset of right testicular pain and swelling starting around lunchtime today. 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 Patient reports that he has occasional pain in the testicle but nothing this severe in the past. He has been able to urinate without any problem. He denies trauma to the area. No dysuria, hematuria, change in urine stream, or history of prostate problems. No history of kidney stones. He denies fever, nausea or vomiting. No stool changes. Palpation makes the pain worse. Elevation makes the pain better. Course is constant.  The history is provided by the patient.    Past Medical History  Diagnosis Date  . No pertinent past medical history   . Hyperlipemia   . Arthritis   . GERD (gastroesophageal reflux disease)   . Wears glasses   . Cancer     SKIN CANCERS REMOVED  . Hypertension     NEW DIAGNOSIS 05/28/14 -PT PUT ON METOPROLOL AND IS TO FOLLOW UP WITH HIS MEDCIAL DOCTOR TODAY  06/26/14   Past Surgical History  Procedure Laterality Date  . Shoulder arthroscopy w/ rotator cuff repair  2010    left  . Spermatocelectomy  2010  . Inguinal hernia repair  2002    lt  . Cervical fusion  2006  . Colonoscopy    . Dupuytren contracture release  02/02/2012    Procedure: DUPUYTREN CONTRACTURE RELEASE;  Surgeon: Wynonia Sours, MD;  Location: [facility];  Service: Orthopedics;  Laterality: Right;  fasciectomy right little, middle, and ring fingers of right hand.  . Shoulder arthroscopy      right x2  . Fasciotomy Left 05/17/2013    Procedure: FASCIECTOMY LEFT SMALL, RING, MIDDLE FINGERS;   Surgeon: Wynonia Sours, MD;  Location: [facility];  Service: Orthopedics;  Laterality: Left;  . Umbilical hernia repair N/A 05/17/2013    Procedure: HERNIA REPAIR UMBILICAL ADULT;  Surgeon: Merrie Roof, MD;  Location: [facility];  Service: General;  Laterality: N/A;  . Biceps tenodesis- left shoulder  04/16/14    [facility]  . Total hip arthroplasty Left 07/03/2014    Procedure: LEFT TOTAL HIP ARTHROPLASTY ANTERIOR APPROACH;  Surgeon: Mauri Pole, MD;  Location: [facility];  Service: Orthopedics;  Laterality: Left;   Family History  Problem Relation Age of Onset  . Kidney disease Mother   . Cancer Sister     Breast  . Cancer Maternal Grandmother     Breast   History  Substance Use Topics  . Smoking status: Former Smoker -- 1.00 packs/day for 20 years    Types: Cigarettes  . Smokeless tobacco: Never Used  . Alcohol Use: Yes     Comment: FRIDAY SAT SIM - MIXED DRINKS      QUIT SMOKING 1996    Review of Systems  Constitutional: Negative for fever.  HENT: Negative for rhinorrhea and sore throat.   Eyes: Negative for redness.  Respiratory: Negative for cough.   Cardiovascular: Negative for chest pain.  Gastrointestinal: Negative for nausea, vomiting, abdominal pain, diarrhea and blood in stool.  Genitourinary: Positive for scrotal swelling and testicular pain.  Negative for dysuria, urgency, frequency, hematuria, flank pain, decreased urine volume, discharge, penile swelling, difficulty urinating, genital sores and penile pain.  Musculoskeletal: Negative for myalgias.  Skin: Negative for rash.  Neurological: Negative for headaches.      Allergies  Penicillins  Home Medications   Prior to Admission medications   Medication Sig Start Date End Date Taking? Authorizing Provider  acetaminophen (TYLENOL) 500 MG tablet Take 2 tablets (1,000 mg total) by mouth every 8 (eight) hours. 07/04/14   Danae Orleans, PA-C  CALCIUM PO Take 1,000  mg by mouth at bedtime.    Historical Provider, MD  Coenzyme Q10 (CO Q 10 PO) Take 500 mg by mouth at bedtime.    Historical Provider, MD  docusate sodium 100 MG CAPS Take 100 mg by mouth 2 (two) times daily. 07/04/14   Danae Orleans, PA-C  ferrous sulfate 325 (65 FE) MG tablet Take 1 tablet (325 mg total) by mouth 3 (three) times daily after meals. 07/04/14   Danae Orleans, PA-C  fish oil-omega-3 fatty acids 1000 MG capsule Take 1 g by mouth at bedtime.     Historical Provider, MD  methocarbamol (ROBAXIN) 500 MG tablet Take 1 tablet (500 mg total) by mouth every 6 (six) hours as needed for muscle spasms. 07/04/14   Danae Orleans, PA-C  metoprolol succinate (TOPROL-XL) 50 MG 24 hr tablet Take 100 mg by mouth every evening. Take with or immediately following a meal.    Historical Provider, MD  Multiple Vitamins-Minerals (MULTIVITAMIN WITH MINERALS) tablet Take 1 tablet by mouth at bedtime.     Historical Provider, MD  omeprazole (PRILOSEC) 20 MG capsule Take 20 mg by mouth every morning.     Historical Provider, MD  oxyCODONE (OXY IR/ROXICODONE) 5 MG immediate release tablet Take 1-3 tablets (5-15 mg total) by mouth every 4 (four) hours as needed for moderate pain or severe pain. 07/04/14   Danae Orleans, PA-C  polyethylene glycol (MIRALAX / GLYCOLAX) packet Take 17 g by mouth 2 (two) times daily. 07/04/14   Danae Orleans, PA-C  simvastatin (ZOCOR) 40 MG tablet Take 40 mg by mouth at bedtime.    Historical Provider, MD   BP 124/88 mmHg  Pulse 63  Temp(Src) 98.2 F (36.8 C) (Oral)  Resp 16  Ht 5\' 10"  (1.778 m)  Wt 225 lb (102.059 kg)  BMI 32.28 kg/m2  SpO2 99% Physical Exam  Constitutional: He appears well-developed and well-nourished.  HENT:  Head: Normocephalic and atraumatic.  Eyes: Conjunctivae are normal. Right eye exhibits no discharge. Left eye exhibits no discharge.  Neck: Normal range of motion. Neck supple.  Cardiovascular: Normal rate, regular rhythm and normal heart sounds.    Pulmonary/Chest: Effort normal and breath sounds normal.  Abdominal: Soft. There is no tenderness.  Genitourinary: Penis normal. Right testis shows swelling and tenderness. Right testis shows no mass. Left testis shows no mass, no swelling and no tenderness. Circumcised.  Right testicle is very tender to palpation.  Neurological: He is alert.  Skin: Skin is warm and dry.  Psychiatric: He has a normal mood and affect.  Nursing note and vitals reviewed.   ED Course  Procedures (including critical care time) Labs Review Labs Reviewed  URINE CULTURE  URINALYSIS, ROUTINE W REFLEX MICROSCOPIC (NOT AT [facility])    Imaging Review US Scrotum  01/24/2015   CLINICAL DATA:  Severe right scrotal region pain  EXAM: SCROTAL ULTRASOUND  DOPPLER ULTRASOUND OF THE TESTICLES  TECHNIQUE: Complete ultrasound examination of the testicles, epididymis, and  other scrotal structures was performed. Color and spectral Doppler ultrasound were also utilized to evaluate blood flow to the testicles.  COMPARISON:  None.  FINDINGS: Right testicle  Measurements: 4.4 x 2.9 x 3.4 cm. No mass. There is minimal microlithiasis on the right.  Left testicle  Measurements: 5.1 x 2.4 x 2.8 cm. No mass or microlithiasis visualized.  Right epididymis: The right epididymis appears hyperemic and mildly edematous without focal mass or abscess.  Left epididymis: There is a complex solid structure arising within the epididymis on the left measuring 1.6 x 1.6 cm. Masslike area shows mild vascularity. There does not appear to be sprain hyperemia on the left.  Hydrocele:  None visualized.  Varicocele:  None visualized.  Pulsed Doppler interrogation of both testes demonstrates normal low resistance arterial and venous waveforms bilaterally. The peak systolic velocity in the left testis is 5 cm/sec. The peak systolic velocity in the right testis is 9 cm/sec.  There is no appreciable scrotal wall thickening or abscess.  IMPRESSION: No intratesticular  mass or torsion.  Right-sided epididymal edema and hyperemia consistent with epididymitis.  There is a solid structure arising from the left epididymis, consistent with a mass arising in this area. This mass shows modest vascularity. Statistically, most extratesticular masses are benign. It may be prudent to consider a followup ultrasound after antibiotic treatment for the epididymitis on the right to assess for possible inflammatory etiology for the masslike area arising from the left epididymis. Urologic consultation advised given this finding on the left.  Rather minimal microlithiasis right testis. Current literature suggests that testicular microlithiasis is not a significant independent risk factor for development of testicular carcinoma, and that follow up imaging is not warranted in the absence of other risk factors. Monthly testicular self-examination and annual physical exams are considered appropriate surveillance. If patient has other risk factors for testicular carcinoma, then referral to Urology should be considered. (Reference: DeCastro, et al.: A 5-Year Follow up Study of Asymptomatic Men with Testicular Microlithiasis. J Urol 2008; 101:7510-2585.)   Electronically Signed   By: Lowella Grip III M.D.   On: 01/24/2015 20:50   [place] Art/ven Flow Abd Pelv Doppler  01/24/2015   CLINICAL DATA:  Severe right scrotal region pain  EXAM: SCROTAL ULTRASOUND  DOPPLER ULTRASOUND OF THE TESTICLES  TECHNIQUE: Complete ultrasound examination of the testicles, epididymis, and other scrotal structures was performed. Color and spectral Doppler ultrasound were also utilized to evaluate blood flow to the testicles.  COMPARISON:  None.  FINDINGS: Right testicle  Measurements: 4.4 x 2.9 x 3.4 cm. No mass. There is minimal microlithiasis on the right.  Left testicle  Measurements: 5.1 x 2.4 x 2.8 cm. No mass or microlithiasis visualized.  Right epididymis: The right epididymis appears hyperemic and mildly edematous  without focal mass or abscess.  Left epididymis: There is a complex solid structure arising within the epididymis on the left measuring 1.6 x 1.6 cm. Masslike area shows mild vascularity. There does not appear to be sprain hyperemia on the left.  Hydrocele:  None visualized.  Varicocele:  None visualized.  Pulsed Doppler interrogation of both testes demonstrates normal low resistance arterial and venous waveforms bilaterally. The peak systolic velocity in the left testis is 5 cm/sec. The peak systolic velocity in the right testis is 9 cm/sec.  There is no appreciable scrotal wall thickening or abscess.  IMPRESSION: No intratesticular mass or torsion.  Right-sided epididymal edema and hyperemia consistent with epididymitis.  There is a solid structure arising from  the left epididymis, consistent with a mass arising in this area. This mass shows modest vascularity. Statistically, most extratesticular masses are benign. It may be prudent to consider a followup ultrasound after antibiotic treatment for the epididymitis on the right to assess for possible inflammatory etiology for the masslike area arising from the left epididymis. Urologic consultation advised given this finding on the left.  Rather minimal microlithiasis right testis. Current literature suggests that testicular microlithiasis is not a significant independent risk factor for development of testicular carcinoma, and that follow up imaging is not warranted in the absence of other risk factors. Monthly testicular self-examination and annual physical exams are considered appropriate surveillance. If patient has other risk factors for testicular carcinoma, then referral to Urology should be considered. (Reference: DeCastro, et al.: A 5-Year Follow up Study of Asymptomatic Men with Testicular Microlithiasis. J Urol 2008; 657:8469-6295.)   Electronically Signed   By: Lowella Grip III M.D.   On: 01/24/2015 20:50     EKG Interpretation None        Patient seen and examined. Work-up initiated. Medications ordered.   Vital signs reviewed and are as follows: BP 124/88 mmHg  Pulse 63  Temp(Src) 98.2 F (36.8 C) (Oral)  Resp 16  Ht 5\' 10"  (1.778 m)  Wt 225 lb (102.059 kg)  BMI 32.28 kg/m2  SpO2 99%  Pain controlled after IV Dilaudid. Patient with ultrasound suggestive of epididymitis. Patient also informed of masslike area adjacent to the left testicle which will require urology follow-up. It is unclear if any of patient's symptoms today are related to his previous complication after vasectomy. Encouraged urology follow-up to ensure resolution. Started on Cipro.   Percocet for home. Patient counseled on use of narcotic pain medications. Counseled not to combine these medications with others containing tylenol. Urged not to drink alcohol, drive, or perform any other activities that requires focus while taking these medications. The patient verbalizes understanding and agrees with the plan.   Patient urged to return with worsening symptoms or other concerns. Patient verbalized understanding and agrees with plan.    MDM   Final diagnoses:  Testicular pain, right  Epididymitis, right   Patient with testicular pain and swelling consistent with epididymitis. Patient denies irritative symptoms of cystitis and urine is normal. Do not suspect that this is referred pain from kidney stone or other intra-abdominal etiology. Pain controlled in ED. No other systemic symptoms of illness. Urology follow-up indicated. Referral given. Pain medication and antibiotics for home. Ten-day course of Cipro given.    [name] Cater, PA-C 01/24/15 [place], MD 01/24/15 [phone]

## 2015-01-24 NOTE — Discharge Instructions (Signed)
Please read and follow all provided instructions.  Your diagnoses today include:  1. Testicular pain   2. Testicular pain, right   3. Epididymitis, right     Tests performed today include:  Urine test - no blood or other problems  Ultrasound - shows epididymitis and also mass on left testicle that will need to be evaluated by a urologist.   Vital signs. See below for your results today.   Medications prescribed:   Ciprofloxacin - antibiotic  You have been prescribed an antibiotic medicine: take the entire course of medicine even if you are feeling better. Stopping early can cause the antibiotic not to work.   Percocet (oxycodone/acetaminophen) - narcotic pain medication  DO NOT drive or perform any activities that require you to be awake and alert because this medicine can make you drowsy. BE VERY CAREFUL not to take multiple medicines containing Tylenol (also called acetaminophen). Doing so can lead to an overdose which can damage your liver and cause liver failure and possibly death.  Take any prescribed medications only as directed.  Home care instructions:  Follow any educational materials contained in this packet.  BE VERY CAREFUL not to take multiple medicines containing Tylenol (also called acetaminophen). Doing so can lead to an overdose which can damage your liver and cause liver failure and possibly death.   Follow-up instructions: Please follow-up with your urologist in the next 3 days for further evaluation of your symptoms.   Return instructions:   Please return to the Emergency Department if you experience worsening symptoms.   Return with fever, vomiting, worsening pain or swelling.   Please return if you have any other emergent concerns.  Additional Information:  Your vital signs today were: BP 124/88 mmHg   Pulse 63   Temp(Src) 98.2 F (36.8 C) (Oral)   Resp 16   Ht 5\' 10"  (1.778 m)   Wt 225 lb (102.059 kg)   BMI 32.28 kg/m2   SpO2 99% If your blood  pressure (BP) was elevated above 135/85 this visit, please have this repeated by your doctor within one month. --------------

## 2015-01-26 LAB — URINE CULTURE: CULTURE: NO GROWTH

## 2015-03-06 ENCOUNTER — Other Ambulatory Visit: Payer: Self-pay | Admitting: Orthopedic Surgery

## 2015-05-10 ENCOUNTER — Encounter (HOSPITAL_BASED_OUTPATIENT_CLINIC_OR_DEPARTMENT_OTHER)
Admission: RE | Admit: 2015-05-10 | Discharge: 2015-05-10 | Disposition: A | Discharge status: Home / Self Care | Payer: BLUE CROSS/BLUE SHIELD | Source: Ambulatory Visit | Attending: Orthopedic Surgery | Admitting: Orthopedic Surgery

## 2015-05-10 ENCOUNTER — Other Ambulatory Visit: Payer: Self-pay

## 2015-05-10 DIAGNOSIS — Z85828 Personal history of other malignant neoplasm of skin: Secondary | ICD-10-CM | POA: Diagnosis not present

## 2015-05-10 DIAGNOSIS — I10 Essential (primary) hypertension: Secondary | ICD-10-CM | POA: Diagnosis not present

## 2015-05-10 DIAGNOSIS — M720 Palmar fascial fibromatosis [Dupuytren]: Secondary | ICD-10-CM | POA: Diagnosis not present

## 2015-05-10 DIAGNOSIS — Z87891 Personal history of nicotine dependence: Secondary | ICD-10-CM | POA: Diagnosis not present

## 2015-05-10 DIAGNOSIS — Z01818 Encounter for other preprocedural examination: Secondary | ICD-10-CM | POA: Insufficient documentation

## 2015-05-10 DIAGNOSIS — M20092 Other deformity of left finger(s): Secondary | ICD-10-CM | POA: Diagnosis present

## 2015-05-10 DIAGNOSIS — E785 Hyperlipidemia, unspecified: Secondary | ICD-10-CM | POA: Diagnosis not present

## 2015-05-10 DIAGNOSIS — M21242 Flexion deformity, left finger joints: Secondary | ICD-10-CM | POA: Diagnosis not present

## 2015-05-10 DIAGNOSIS — Z880 Allergy status to penicillin: Secondary | ICD-10-CM | POA: Diagnosis not present

## 2015-05-10 LAB — BASIC METABOLIC PANEL
Anion gap: 6 (ref 5–15)
BUN: 16 mg/dL (ref 6–20)
CHLORIDE: 101 mmol/L (ref 101–111)
CO2: 28 mmol/L (ref 22–32)
Calcium: 9.6 mg/dL (ref 8.9–10.3)
Creatinine, Ser: 1.08 mg/dL (ref 0.61–1.24)
GFR calc Af Amer: >60 mL/min (ref >60)
GFR calc non Af Amer: >60 mL/min (ref >60)
GLUCOSE: 97 mg/dL (ref 65–99)
POTASSIUM: 5.4 mmol/L — AB (ref 3.5–5.1)
SODIUM: 135 mmol/L (ref 135–145)

## 2015-05-10 NOTE — Progress Notes (Signed)
Lab result to dr Dian Situ

## 2015-05-10 NOTE — Progress Notes (Signed)
Dr crews cleared ekg

## 2015-05-16 ENCOUNTER — Encounter (HOSPITAL_BASED_OUTPATIENT_CLINIC_OR_DEPARTMENT_OTHER): Payer: Self-pay | Admitting: *Deleted

## 2015-05-16 ENCOUNTER — Ambulatory Visit (HOSPITAL_BASED_OUTPATIENT_CLINIC_OR_DEPARTMENT_OTHER): Payer: BLUE CROSS/BLUE SHIELD | Admitting: Anesthesiology

## 2015-05-16 ENCOUNTER — Encounter (HOSPITAL_BASED_OUTPATIENT_CLINIC_OR_DEPARTMENT_OTHER)
Admission: RE | Disposition: A | Discharge status: Home / Self Care | Payer: Self-pay | Source: Ambulatory Visit | Attending: Orthopedic Surgery

## 2015-05-16 ENCOUNTER — Ambulatory Visit (HOSPITAL_BASED_OUTPATIENT_CLINIC_OR_DEPARTMENT_OTHER)
Admission: RE | Admit: 2015-05-16 | Discharge: 2015-05-16 | Disposition: A | Discharge status: Home / Self Care | Payer: BLUE CROSS/BLUE SHIELD | Source: Ambulatory Visit | Attending: Orthopedic Surgery | Admitting: Orthopedic Surgery

## 2015-05-16 DIAGNOSIS — Z880 Allergy status to penicillin: Secondary | ICD-10-CM | POA: Insufficient documentation

## 2015-05-16 DIAGNOSIS — M21242 Flexion deformity, left finger joints: Secondary | ICD-10-CM | POA: Insufficient documentation

## 2015-05-16 DIAGNOSIS — M720 Palmar fascial fibromatosis [Dupuytren]: Secondary | ICD-10-CM | POA: Diagnosis not present

## 2015-05-16 DIAGNOSIS — I10 Essential (primary) hypertension: Secondary | ICD-10-CM | POA: Insufficient documentation

## 2015-05-16 DIAGNOSIS — E785 Hyperlipidemia, unspecified: Secondary | ICD-10-CM | POA: Insufficient documentation

## 2015-05-16 DIAGNOSIS — Z87891 Personal history of nicotine dependence: Secondary | ICD-10-CM | POA: Insufficient documentation

## 2015-05-16 DIAGNOSIS — Z85828 Personal history of other malignant neoplasm of skin: Secondary | ICD-10-CM | POA: Insufficient documentation

## 2015-05-16 HISTORY — PX: FASCIECTOMY: SHX6525

## 2015-05-16 HISTORY — PX: SKIN FULL THICKNESS GRAFT: SHX442

## 2015-05-16 SURGERY — FASCIECTOMY, PALM
Anesthesia: General | Site: Finger | Laterality: Left

## 2015-05-16 MED ORDER — MIDAZOLAM HCL 2 MG/2ML IJ SOLN
1.0000 mg | INTRAMUSCULAR | Status: DC | PRN
  Administered 2015-05-16: 2 mg via INTRAVENOUS

## 2015-05-16 MED ORDER — GLYCOPYRROLATE 0.2 MG/ML IJ SOLN: 0.2000 mg | Freq: Once | INTRAMUSCULAR | Status: DC | PRN

## 2015-05-16 MED ORDER — FENTANYL CITRATE (PF) 100 MCG/2ML IJ SOLN
INTRAMUSCULAR | Status: AC
  Filled 2015-05-16: qty 4

## 2015-05-16 MED ORDER — FENTANYL CITRATE (PF) 100 MCG/2ML IJ SOLN
25.0000 ug | INTRAMUSCULAR | Status: DC | PRN
  Administered 2015-05-16 (×2): 50 ug via INTRAVENOUS

## 2015-05-16 MED ORDER — FENTANYL CITRATE (PF) 100 MCG/2ML IJ SOLN
INTRAMUSCULAR | Status: AC
  Filled 2015-05-16: qty 2

## 2015-05-16 MED ORDER — ONDANSETRON HCL 4 MG/2ML IJ SOLN
INTRAMUSCULAR | Status: AC
  Filled 2015-05-16: qty 2

## 2015-05-16 MED ORDER — DEXAMETHASONE SODIUM PHOSPHATE 10 MG/ML IJ SOLN
INTRAMUSCULAR | Status: AC
  Filled 2015-05-16: qty 1

## 2015-05-16 MED ORDER — SCOPOLAMINE 1 MG/3DAYS TD PT72: 1.0000 | MEDICATED_PATCH | Freq: Once | TRANSDERMAL | Status: DC | PRN

## 2015-05-16 MED ORDER — LIDOCAINE HCL (CARDIAC) 20 MG/ML IV SOLN
INTRAVENOUS | Status: AC
  Filled 2015-05-16: qty 5

## 2015-05-16 MED ORDER — BUPIVACAINE HCL (PF) 0.25 % IJ SOLN
INTRAMUSCULAR | Status: DC | PRN
Start: 2015-05-16 — End: 2015-05-16
  Administered 2015-05-16: 7 mL

## 2015-05-16 MED ORDER — HYDROCODONE-ACETAMINOPHEN 5-325 MG PO TABS: 1.0000 | ORAL_TABLET | Freq: Four times a day (QID) | ORAL | Status: DC | PRN

## 2015-05-16 MED ORDER — THROMBIN 5000 UNITS EX SOLR
CUTANEOUS | Status: DC | PRN
  Administered 2015-05-16: 5000 [IU] via TOPICAL

## 2015-05-16 MED ORDER — PROPOFOL 500 MG/50ML IV EMUL
INTRAVENOUS | Status: AC
  Filled 2015-05-16: qty 50

## 2015-05-16 MED ORDER — PROPOFOL 10 MG/ML IV BOLUS
INTRAVENOUS | Status: DC | PRN
  Administered 2015-05-16: 200 mg via INTRAVENOUS

## 2015-05-16 MED ORDER — THROMBIN 5000 UNITS EX SOLR
CUTANEOUS | Status: AC
  Filled 2015-05-16: qty 5000

## 2015-05-16 MED ORDER — CHLORHEXIDINE GLUCONATE 4 % EX LIQD: 60.0000 mL | Freq: Once | CUTANEOUS | Status: DC

## 2015-05-16 MED ORDER — ONDANSETRON HCL 4 MG/2ML IJ SOLN
INTRAMUSCULAR | Status: DC | PRN
  Administered 2015-05-16: 4 mg via INTRAVENOUS

## 2015-05-16 MED ORDER — LACTATED RINGERS IV SOLN
INTRAVENOUS | Status: DC
  Administered 2015-05-16 (×2): via INTRAVENOUS

## 2015-05-16 MED ORDER — PROMETHAZINE HCL 25 MG/ML IJ SOLN: 6.2500 mg | INTRAMUSCULAR | Status: DC | PRN

## 2015-05-16 MED ORDER — FENTANYL CITRATE (PF) 100 MCG/2ML IJ SOLN
50.0000 ug | INTRAMUSCULAR | Status: AC | PRN
  Administered 2015-05-16: 100 ug via INTRAVENOUS
  Administered 2015-05-16 (×2): 50 ug via INTRAVENOUS

## 2015-05-16 MED ORDER — VANCOMYCIN HCL IN DEXTROSE 1-5 GM/200ML-% IV SOLN
INTRAVENOUS | Status: AC
  Filled 2015-05-16: qty 200

## 2015-05-16 MED ORDER — OXYCODONE HCL 5 MG PO TABS
5.0000 mg | ORAL_TABLET | Freq: Once | ORAL | Status: AC
  Administered 2015-05-16: 5 mg via ORAL

## 2015-05-16 MED ORDER — LIDOCAINE HCL (CARDIAC) 20 MG/ML IV SOLN
INTRAVENOUS | Status: DC | PRN
  Administered 2015-05-16: 50 mg via INTRAVENOUS

## 2015-05-16 MED ORDER — OXYCODONE HCL 5 MG PO TABS
ORAL_TABLET | ORAL | Status: AC
  Filled 2015-05-16: qty 1

## 2015-05-16 MED ORDER — SUCCINYLCHOLINE CHLORIDE 20 MG/ML IJ SOLN
INTRAMUSCULAR | Status: AC
  Filled 2015-05-16: qty 1

## 2015-05-16 MED ORDER — BUPIVACAINE HCL (PF) 0.25 % IJ SOLN
INTRAMUSCULAR | Status: AC
  Filled 2015-05-16: qty 30

## 2015-05-16 MED ORDER — EPINEPHRINE HCL 1 MG/ML IJ SOLN
INTRAMUSCULAR | Status: AC
  Filled 2015-05-16: qty 1

## 2015-05-16 MED ORDER — VANCOMYCIN HCL IN DEXTROSE 1-5 GM/200ML-% IV SOLN
1000.0000 mg | INTRAVENOUS | Status: AC
  Administered 2015-05-16: 1000 mg via INTRAVENOUS

## 2015-05-16 MED ORDER — VANCOMYCIN HCL IN DEXTROSE 1-5 GM/200ML-% IV SOLN: 1000.0000 mg | INTRAVENOUS | Status: DC

## 2015-05-16 MED ORDER — MIDAZOLAM HCL 2 MG/2ML IJ SOLN
INTRAMUSCULAR | Status: AC
  Filled 2015-05-16: qty 4

## 2015-05-16 MED ORDER — LIDOCAINE HCL (PF) 1 % IJ SOLN
INTRAMUSCULAR | Status: AC
  Filled 2015-05-16: qty 30

## 2015-05-16 MED ORDER — DEXAMETHASONE SODIUM PHOSPHATE 10 MG/ML IJ SOLN
INTRAMUSCULAR | Status: DC | PRN
  Administered 2015-05-16: 10 mg via INTRAVENOUS

## 2015-05-16 MED ORDER — EPHEDRINE SULFATE 50 MG/ML IJ SOLN
INTRAMUSCULAR | Status: AC
  Filled 2015-05-16: qty 1

## 2015-05-16 SURGICAL SUPPLY — 49 items
BLADE MINI RND TIP GREEN BEAV (BLADE) ×2 IMPLANT
BLADE SURG 15 STRL LF DISP TIS (BLADE) ×1 IMPLANT
BLADE SURG 15 STRL SS (BLADE) ×1
BNDG COHESIVE 3X5 TAN STRL LF (GAUZE/BANDAGES/DRESSINGS) ×2 IMPLANT
BNDG ESMARK 4X9 LF (GAUZE/BANDAGES/DRESSINGS) ×2 IMPLANT
BNDG GAUZE ELAST 4 BULKY (GAUZE/BANDAGES/DRESSINGS) ×2 IMPLANT
CHLORAPREP W/TINT 26ML (MISCELLANEOUS) ×2 IMPLANT
CORDS BIPOLAR (ELECTRODE) ×2 IMPLANT
COVER BACK TABLE 60X90IN (DRAPES) ×2 IMPLANT
COVER MAYO STAND STRL (DRAPES) ×2 IMPLANT
CUFF TOURNIQUET SINGLE 18IN (TOURNIQUET CUFF) ×2 IMPLANT
DECANTER SPIKE VIAL GLASS SM (MISCELLANEOUS) IMPLANT
DEPRESSOR TONGUE BLADE STERILE (MISCELLANEOUS) IMPLANT
DRAPE EXTREMITY T 121X128X90 (DRAPE) ×2 IMPLANT
DRAPE SURG 17X23 STRL (DRAPES) ×2 IMPLANT
GAUZE SPONGE 4X4 12PLY STRL (GAUZE/BANDAGES/DRESSINGS) ×2 IMPLANT
GAUZE XEROFORM 1X8 LF (GAUZE/BANDAGES/DRESSINGS) ×2 IMPLANT
GLOVE BIOGEL PI IND STRL 7.0 (GLOVE) ×1 IMPLANT
GLOVE BIOGEL PI IND STRL 8.5 (GLOVE) ×1 IMPLANT
GLOVE BIOGEL PI INDICATOR 7.0 (GLOVE) ×1
GLOVE BIOGEL PI INDICATOR 8.5 (GLOVE) ×1
GLOVE ECLIPSE 6.5 STRL STRAW (GLOVE) ×2 IMPLANT
GLOVE EXAM NITRILE EXT CUFF MD (GLOVE) ×2 IMPLANT
GLOVE SURG ORTHO 8.0 STRL STRW (GLOVE) ×2 IMPLANT
GOWN STRL REUS W/ TWL LRG LVL3 (GOWN DISPOSABLE) ×1 IMPLANT
GOWN STRL REUS W/TWL LRG LVL3 (GOWN DISPOSABLE) ×1
GOWN STRL REUS W/TWL XL LVL3 (GOWN DISPOSABLE) ×2 IMPLANT
LOOP VESSEL MAXI BLUE (MISCELLANEOUS) ×2 IMPLANT
NEEDLE PRECISIONGLIDE 27X1.5 (NEEDLE) ×2 IMPLANT
NS IRRIG 1000ML POUR BTL (IV SOLUTION) ×2 IMPLANT
PACK BASIN DAY SURGERY FS (CUSTOM PROCEDURE TRAY) ×2 IMPLANT
PAD CAST 3X4 CTTN HI CHSV (CAST SUPPLIES) ×1 IMPLANT
PADDING CAST ABS 3INX4YD NS (CAST SUPPLIES)
PADDING CAST ABS 4INX4YD NS (CAST SUPPLIES) ×1
PADDING CAST ABS COTTON 3X4 (CAST SUPPLIES) IMPLANT
PADDING CAST ABS COTTON 4X4 ST (CAST SUPPLIES) ×1 IMPLANT
PADDING CAST COTTON 3X4 STRL (CAST SUPPLIES) ×1
SLEEVE SCD COMPRESS KNEE MED (MISCELLANEOUS) ×2 IMPLANT
SPLINT PLASTER CAST XFAST 3X15 (CAST SUPPLIES) IMPLANT
SPLINT PLASTER XTRA FASTSET 3X (CAST SUPPLIES)
STAPLER VISISTAT 35W (STAPLE) IMPLANT
STOCKINETTE 4X48 STRL (DRAPES) ×2 IMPLANT
SUT CHROMIC 5 0 P 3 (SUTURE) ×2 IMPLANT
SUT ETHILON 4 0 PS 2 18 (SUTURE) ×2 IMPLANT
SUT SILK 2 0 FS (SUTURE) ×2 IMPLANT
SYR BULB 3OZ (MISCELLANEOUS) ×2 IMPLANT
SYR CONTROL 10ML LL (SYRINGE) ×2 IMPLANT
TOWEL OR 17X24 6PK STRL BLUE (TOWEL DISPOSABLE) ×4 IMPLANT
UNDERPAD 30X30 (UNDERPADS AND DIAPERS) ×2 IMPLANT

## 2015-05-16 NOTE — Anesthesia Preprocedure Evaluation (Signed)
Anesthesia Evaluation  Patient identified by MRN, date of birth, ID band Patient awake    Reviewed: Allergy & Precautions, NPO status , Patient's Chart, lab work & pertinent test results  Airway Mallampati: II  TM Distance: >3 FB Neck ROM: Full    Dental no notable dental hx.    Pulmonary neg pulmonary ROS, former smoker,    Pulmonary exam normal breath sounds clear to auscultation       Cardiovascular hypertension, Normal cardiovascular exam Rhythm:Regular Rate:Normal     Neuro/Psych negative neurological ROS  negative psych ROS   GI/Hepatic Neg liver ROS, GERD  ,  Endo/Other  negative endocrine ROS  Renal/GU negative Renal ROS  negative genitourinary   Musculoskeletal negative musculoskeletal ROS (+)   Abdominal   Peds negative pediatric ROS (+)  Hematology negative hematology ROS (+)   Anesthesia Other Findings   Reproductive/Obstetrics negative OB ROS                             Anesthesia Physical Anesthesia Plan  ASA: II  Anesthesia Plan: General   Post-op Pain Management:    Induction: Intravenous  Airway Management Planned: LMA  Additional Equipment:   Intra-op Plan:   Post-operative Plan: Extubation in OR  Informed Consent: I have reviewed the patients History and Physical, chart, labs and discussed the procedure including the risks, benefits and alternatives for the proposed anesthesia with the patient or authorized representative who has indicated his/her understanding and acceptance.   Dental advisory given  Plan Discussed with: CRNA and Surgeon  Anesthesia Plan Comments:         Anesthesia Quick Evaluation

## 2015-05-16 NOTE — Op Note (Signed)
Dictation Number [id number]

## 2015-05-16 NOTE — Brief Op Note (Signed)
05/16/2015  9:56 AM  PATIENT:  John Benton  54 y.o. male  PRE-OPERATIVE DIAGNOSIS:  DUPUYTREN'S CONTRACTURE RECURRENT LEFT SMALL FINGER   POST-OPERATIVE DIAGNOSIS:  DUPUYTREN'S CONTRACTURE RECURRENT LEFT SMALL   PROCEDURE:  Procedure(s): FASCIECTOMY RECURRENCE LEFT SMALL FINGER WITH  (Left) HYPOTHENAR SKIN GRAFT  (Left)  SURGEON:  Surgeon(s) and Role:    * Daryll Brod, MD - Primary  PHYSICIAN ASSISTANT:   ASSISTANTS: none   ANESTHESIA:   local and general  EBL:  Total I/O In: 1000 [I.V.:1000] Out: -   BLOOD ADMINISTERED:none  DRAINS: Penrose drain in the left hand   LOCAL MEDICATIONS USED:  BUPIVICAINE   SPECIMEN:  Excision  DISPOSITION OF SPECIMEN:  PATHOLOGY  COUNTS: correct  TOURNIQUET:   Total Tourniquet Time Documented: Upper Arm (Left) - 55 minutes Total: Upper Arm (Left) - 55 minutes   DICTATION: .Other Dictation: Dictation Number [id number]  PLAN OF CARE: Discharge to home after PACU  PATIENT DISPOSITION:  PACU - hemodynamically stable.

## 2015-05-16 NOTE — Transfer of Care (Signed)
Immediate Anesthesia Transfer of Care Note  Patient: John Benton  Procedure(s) Performed: Procedure(s): FASCIECTOMY RECURRENCE LEFT SMALL FINGER WITH  (Left) HYPOTHENAR SKIN GRAFT  (Left)  Patient Location: PACU  Anesthesia Type:General  Level of Consciousness: awake, alert  and oriented  Airway & Oxygen Therapy: Patient Spontanous Breathing and Patient connected to face mask oxygen  Post-op Assessment: Report given to RN and Post -op Vital signs reviewed and stable  Post vital signs: Reviewed and stable  Last Vitals:  Filed Vitals:   05/16/15 1004  BP:   Pulse: 78  Temp:   Resp: 13    Complications: No apparent anesthesia complications

## 2015-05-16 NOTE — Anesthesia Procedure Notes (Signed)
Procedure Name: LMA Insertion Date/Time: 05/16/2015 8:41 AM Performed by: Melynda Ripple D Pre-anesthesia Checklist: Patient identified, Emergency Drugs available, Suction available and Patient being monitored Patient Re-evaluated:Patient Re-evaluated prior to inductionOxygen Delivery Method: Circle System Utilized Preoxygenation: Pre-oxygenation with 100% oxygen Intubation Type: IV induction Ventilation: Mask ventilation without difficulty LMA: LMA inserted LMA Size: 5.0 Number of attempts: 1 Airway Equipment and Method: Bite block Placement Confirmation: positive ETCO2 Tube secured with: Tape Dental Injury: Teeth and Oropharynx as per pre-operative assessment

## 2015-05-16 NOTE — Anesthesia Postprocedure Evaluation (Signed)
  Anesthesia Post-op Note  Patient: John Benton  Procedure(s) Performed: Procedure(s) (LRB): FASCIECTOMY RECURRENCE LEFT SMALL FINGER WITH  (Left) HYPOTHENAR SKIN GRAFT  (Left)  Patient Location: PACU  Anesthesia Type: General  Level of Consciousness: awake and alert   Airway and Oxygen Therapy: Patient Spontanous Breathing  Post-op Pain: mild  Post-op Assessment: Post-op Vital signs reviewed, Patient's Cardiovascular Status Stable, Respiratory Function Stable, Patent Airway and No signs of Nausea or vomiting  Last Vitals:  Filed Vitals:   05/16/15 1030  BP: 109/87  Pulse: 72  Temp:   Resp: 11    Post-op Vital Signs: stable   Complications: No apparent anesthesia complications

## 2015-05-16 NOTE — Discharge Instructions (Addendum)

## 2015-05-16 NOTE — H&P (Signed)
John Benton is a 54 year old right hand dominant male s/p Dupuytren's excision with a fasciectomy of his bilateral middle, ring and little fingers. He states that his left little finger has now begun to recur and progress especially at the PIP jont. He states this has significantly changed over the past 3 months. He has no new history of injury.  PAST MEDICAL HISTORY: He has an allergy to PCN. He is on Omeprazole, Simvastatin, lisinopril, calcium, multivitamins and fish oil. He has had surgery on both shoulders, C-spine fusion, hernia and bilateral Dupuytren's excision.   FAMILY H ISTORY: Positive for high BP and arthritis.  SOCIAL HISTORY: He does not smoke. He drinks socially. 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   REVIEW OF SYSTEMS: Positive for glasses, cataracts, otherwise negative. John Benton is an 54 y.o. male.   Chief Complaint: contracture left small finger HPI: see above  Past Medical History  Diagnosis Date  . No pertinent past medical history   . Hyperlipemia   . Arthritis   . GERD (gastroesophageal reflux disease)   . Wears glasses   . Cancer ([place])     SKIN CANCERS REMOVED  . Hypertension     NEW DIAGNOSIS 05/28/14 -PT PUT ON METOPROLOL AND IS TO FOLLOW UP WITH HIS MEDCIAL DOCTOR TODAY  06/26/14    Past Surgical History  Procedure Laterality Date  . Shoulder arthroscopy w/ rotator cuff repair  2010    left  . Spermatocelectomy  2010  . Inguinal hernia repair  2002    lt  . Cervical fusion  2006  . Colonoscopy    . Dupuytren contracture release  02/02/2012    Procedure: DUPUYTREN CONTRACTURE RELEASE;  Surgeon: Wynonia Sours, MD;  Location: [facility];  Service: Orthopedics;  Laterality: Right;  fasciectomy right little, middle, and ring fingers of right hand.  . Shoulder arthroscopy      right x2  . Fasciotomy Left 05/17/2013    Procedure: FASCIECTOMY LEFT SMALL, RING, MIDDLE FINGERS;  Surgeon: Wynonia Sours, MD;  Location:  [facility];  Service: Orthopedics;  Laterality: Left;  . Umbilical hernia repair N/A 05/17/2013    Procedure: HERNIA REPAIR UMBILICAL ADULT;  Surgeon: Merrie Roof, MD;  Location: [facility];  Service: General;  Laterality: N/A;  . Biceps tenodesis- left shoulder  04/16/14    [place]  . Total hip arthroplasty Left 07/03/2014    Procedure: LEFT TOTAL HIP ARTHROPLASTY ANTERIOR APPROACH;  Surgeon: Mauri Pole, MD;  Location: [facility];  Service: Orthopedics;  Laterality: Left;    Family History  Problem Relation Age of Onset  . Kidney disease Mother   . Cancer Sister     Breast  . Cancer Maternal Grandmother     Breast   Social History:  reports that he has quit smoking. His smoking use included Cigarettes. He has a 20 pack-year smoking history. He has never used smokeless tobacco. He reports that he drinks alcohol. He reports that he does not use illicit drugs.  Allergies:  Allergies  Allergen Reactions  . Penicillins Anaphylaxis  . Keflex [Cephalexin] Swelling    Medications Prior to Admission  Medication Sig Dispense Refill  . Coenzyme Q10 (CO Q 10 PO) Take 500 mg by mouth at bedtime.    . ferrous sulfate 325 (65 FE) MG tablet Take 1 tablet (325 mg total) by mouth 3 (three) times daily after meals.  3  .  fish oil-omega-3 fatty acids 1000 MG capsule Take 1 g by mouth at bedtime.     Marland Kitchen lisinopril (PRINIVIL,ZESTRIL) 20 MG tablet Take 20 mg by mouth daily.    . Multiple Vitamins-Minerals (MULTIVITAMIN WITH MINERALS) tablet Take 1 tablet by mouth at bedtime.     Marland Kitchen omeprazole (PRILOSEC) 20 MG capsule Take 20 mg by mouth every morning.     . polyethylene glycol (MIRALAX / GLYCOLAX) packet Take 17 g by mouth 2 (two) times daily. 14 each 0  . simvastatin (ZOCOR) 40 MG tablet Take 40 mg by mouth at bedtime.    Marland Kitchen oxyCODONE-acetaminophen (PERCOCET/ROXICET) 5-325 MG per tablet Take 1-2 tablets by mouth every 6 (six) hours as needed for severe  pain. 12 tablet 0    No results found for this or any previous visit (from the past 48 hour(s)).  No results found.   Pertinent items are noted in HPI.  Blood pressure 126/73, pulse 68, temperature 98.1 F (36.7 C), temperature source Oral, resp. rate 20, height 5\' 10"  (1.778 m), weight 98.884 kg (218 lb), SpO2 97 %.  General appearance: alert, cooperative and appears stated age Head: Normocephalic, without obvious abnormality Neck: no JVD Resp: clear to auscultation bilaterally Cardio: regular rate and rhythm, S1, S2 normal, no murmur, click, rub or gallop GI: soft, non-tender; bowel sounds normal; no masses,  no organomegaly Extremities: contracture left small finger Pulses: 2+ and symmetric Skin: Skin color, texture, turgor normal. No rashes or lesions Neurologic: Grossly normal Incision/Wound: healed  Assessment/Plan He desires proceeding to have this taken care of. He is wondering about Xiaflex. We have had a long discussion with respect to Xiaflex, risk of surgery, risk of recurrence. He is aware of this. He is aware of increased risk for injury to arteries, nerves and tendons. He would like to proceed to have this removed. We would recommend proceeding along with a skin graft in an effort to try and limit recurrence to the left small finger. Pre, peri and post op care are discussed along with risks and complications. Patient is aware there is no guarantee with surgery, possibility of infection, injury to arteries, nerves, and tendons, incomplete relief and dystrophy. We will plan on hypothenar skin graft as a fire break.  John Benton 05/16/2015, 7:36 AM

## 2015-05-17 ENCOUNTER — Encounter (HOSPITAL_BASED_OUTPATIENT_CLINIC_OR_DEPARTMENT_OTHER): Payer: Self-pay | Admitting: Orthopedic Surgery

## 2015-05-17 NOTE — Op Note (Signed)
NAME:  John Benton, TURNEY NO.:  [id number]  MEDICAL RECORD NO.:  [id number]  LOCATION:                                 FACILITY:  PHYSICIAN:  Daryll Brod, M.D.       DATE OF BIRTH:  14-May-1961  DATE OF PROCEDURE:  05/16/2015 DATE OF DISCHARGE:                              OPERATIVE REPORT   PREOPERATIVE DIAGNOSIS:  Recurrent Dupuytren contracture, left small finger with proximal interphalangeal flexion deformity.  POSTOPERATIVE DIAGNOSIS:  Recurrent Dupuytren contracture, left small finger with proximal interphalangeal flexion deformity.  OPERATION:  Fasciectomy of left ring finger.  SURGEON:  Daryll Brod, M.D.  ANESTHESIA:  General with local infiltration.  ANESTHESIOLOGIST:  Cleon Dew. Kalman Shan, M.D.  HISTORY:  The patient is a 54 year old male with a history of Dupuytren contracture.  He has undergone release, excision of the palmar fascia on both hands, middle, ring, and small fingers.  He has had recurrence to the small finger, left hand, primarily causing a flexion deformity to the PIP joint.  He is desirous of having this corrected as much as possible.  Pre, peri, and postoperative course have been discussed along with risks and complications.  He is aware that there is no guarantee with surgery; possibility of infection; recurrence of injury to arteries, nerves, tendons; incomplete relief of symptoms; recurrence; potential loss of the finger due to the recurrence, difficulty in excision.  In the preoperative area, the patient was seen, the extremity marked by both the patient and surgeon.  Antibiotic given.  PROCEDURE IN DETAIL:  The patient was brought to the operating room where general anesthetic was carried out without difficulty.  He was prepped using ChloraPrep in supine position with left arm free.  A 3- minute dry time was allowed.  Time-out taken, confirming the patient and procedure.  The limb was exsanguinated with an Esmarch  bandage. Tourniquet placed high on the arm was inflated to 250 mmHg.  The old Erick Blinks incision was duplicated, carried down through subcutaneous tissue with very slow meticulous dissection.  The neurovascular bundle both radially and volarly to the small finger was dissected free from the overlying recurrent Dupuytren cord.  These were tagged with vessel loops.  The cord was traced distally.  A large abductor digiti quinti cord was present.  The ulnar digital artery and nerve were present. Scar to the cord with a spiral component distally just at the level of the PIP joint.  The radial digital neurovascular bundle was also identified proximally.  This was then traced distally.  It was found to lie beneath the cord, coming dorsal to the cord at the PIP joint and then going volar to it distally with blunt and sharp dissection.  Each neurovascular bundle was dissected free.  The cord was then detached from the middle phalanx, taking care to protect the nerve and artery distally and the cord excised and sent to Pathology.  On removal of cord, the PIP joint came fully straight with metacarpophalangeal joint extended.  The wound was copiously irrigated with saline.  Then, thrombin was placed into the depth of the wound.  A doubled over vessel loop drain was placed as  a drain and the wounds closed with interrupted 4-0 nylon sutures.  The tourniquet was then deflated.  The fingers immediately pinked.  A sterile compressive dressing, dorsal splint were applied.  The patient tolerated the procedure well, was taken to the recovery room for observation in satisfactory condition.  Prior to placement of the bandage, a block was given to the neurovascular bundles proximal to the metacarpals to the middle ring and small fingers.  An 8 mL of 0.25% bupivacaine without epinephrine was given.  The patient tolerated the procedure well.  He will be discharged home to return to the [facility] in  1 week on Norco.          ______________________________ Daryll Brod, M.D.     GK/MEDQ  D:  05/16/2015  T:  05/17/2015  Job:  [id number]

## 2016-03-18 ENCOUNTER — Telehealth: Payer: Self-pay

## 2016-03-18 DIAGNOSIS — I10 Essential (primary) hypertension: Secondary | ICD-10-CM

## 2016-03-18 NOTE — Telephone Encounter (Signed)
Pt is coming in to see you in October and would like to have blood work done before his appointment. Please assist.

## 2016-03-18 NOTE — Telephone Encounter (Signed)
Done

## 2016-03-30 DIAGNOSIS — M9901 Segmental and somatic dysfunction of cervical region: Secondary | ICD-10-CM | POA: Diagnosis not present

## 2016-04-08 DIAGNOSIS — I10 Essential (primary) hypertension: Secondary | ICD-10-CM | POA: Diagnosis not present

## 2016-04-08 LAB — CBC
HCT: 48.7 % (ref 38.5–50.0)
Hemoglobin: 16.1 g/dL (ref 13.2–17.1)
MCH: 32.1 pg (ref 27.0–33.0)
MCHC: 33.1 g/dL (ref 32.0–36.0)
MCV: 97.2 fL (ref 80.0–100.0)
MPV: 9.5 fL (ref 7.5–12.5)
Platelets: 234 K/uL (ref 140–400)
RBC: 5.01 MIL/uL (ref 4.20–5.80)
RDW: 14.1 % (ref 11.0–15.0)
WBC: 6 K/uL (ref 3.8–10.8)

## 2016-04-09 LAB — LIPID PANEL
Cholesterol: 242 mg/dL — ABNORMAL HIGH (ref 125–200)
HDL: 55 mg/dL (ref >=40)
LDL Cholesterol: 156 mg/dL — ABNORMAL HIGH (ref <130)
Total CHOL/HDL Ratio: 4.4 Ratio (ref <=5.0)
Triglycerides: 156 mg/dL — ABNORMAL HIGH (ref <150)
VLDL: 31 mg/dL — ABNORMAL HIGH (ref <30)

## 2016-04-09 LAB — COMPREHENSIVE METABOLIC PANEL
Albumin: 4.6 g/dL (ref 3.6–5.1)
Alkaline Phosphatase: 73 U/L (ref 40–115)
CO2: 24 mmol/L (ref 20–31)
Calcium: 10.1 mg/dL (ref 8.6–10.3)
Glucose, Bld: 91 mg/dL (ref 65–99)
Potassium: 4.9 mmol/L (ref 3.5–5.3)

## 2016-04-09 LAB — HEMOGLOBIN A1C
Hgb A1c MFr Bld: 4.9 % (ref <5.7)
Mean Plasma Glucose: 94 mg/dL

## 2016-04-09 LAB — COMPREHENSIVE METABOLIC PANEL WITH GFR
ALT: 17 U/L (ref 9–46)
AST: 23 U/L (ref 10–35)
BUN: 19 mg/dL (ref 7–25)
Chloride: 102 mmol/L (ref 98–110)
Creat: 1.17 mg/dL (ref 0.70–1.33)
Sodium: 138 mmol/L (ref 135–146)
Total Bilirubin: 0.7 mg/dL (ref 0.2–1.2)
Total Protein: 6.9 g/dL (ref 6.1–8.1)

## 2016-04-09 LAB — TSH: TSH: 1.67 mIU/L (ref 0.40–4.50)

## 2016-04-14 ENCOUNTER — Ambulatory Visit (INDEPENDENT_AMBULATORY_CARE_PROVIDER_SITE_OTHER): Payer: BLUE CROSS/BLUE SHIELD | Admitting: Sports Medicine

## 2016-04-14 VITALS — BP 121/74 | HR 59 | Resp 16 | Ht 71.0 in | Wt 199.6 lb

## 2016-04-14 DIAGNOSIS — E785 Hyperlipidemia, unspecified: Secondary | ICD-10-CM | POA: Diagnosis not present

## 2016-04-14 DIAGNOSIS — I10 Essential (primary) hypertension: Secondary | ICD-10-CM

## 2016-04-14 DIAGNOSIS — Z23 Encounter for immunization: Secondary | ICD-10-CM

## 2016-04-14 DIAGNOSIS — Z0000 Encounter for general adult medical examination without abnormal findings: Secondary | ICD-10-CM | POA: Insufficient documentation

## 2016-04-14 MED ORDER — ROSUVASTATIN CALCIUM 40 MG PO TABS: 40.0000 mg | ORAL_TABLET | Freq: Every day | ORAL | 3 refills | Status: DC

## 2016-04-14 MED ORDER — LISINOPRIL 20 MG PO TABS: 10.0000 mg | ORAL_TABLET | Freq: Every day | ORAL | 3 refills | Status: DC

## 2016-04-14 NOTE — Assessment & Plan Note (Signed)
Well-controlled with diet and half dose lisinopril.  As he continues his diet he plans to discontinue his lisinopril if blood pressures remain low.

## 2016-04-14 NOTE — Progress Notes (Signed)
  Subjective:    CC: Establish care.   HPI: 55 yo with history of GERD, HTN, hyperlipidemia, C-spine fusion, R hip replacement presenting to establish care.  He says he is overall a healthy person.  He recently lost 35 pounds, which helped control his BP.  In fact, he came off of lisinopril for a couple of months due to his weight loss, although he was put back on lisinopril 10mg  recently.  He is hoping to lose another 15 pounds. He is a former smoker, quit date 20 years ago.  Last colonoscopy 1 year ago was normal.      Past medical history:  Negative.  See flowsheet/record as well for more information.  Surgical history: Negative.  See flowsheet/record as well for more information.  Family history: Negative.  See flowsheet/record as well for more information.  Social history: Negative.  See flowsheet/record as well for more information.  Allergies, and medications have been entered into the medical record, reviewed, and no changes needed.    Review of Systems: No headache, visual changes, nausea, vomiting, diarrhea, constipation, dizziness, abdominal pain, skin rash, fevers, chills, night sweats, swollen lymph nodes, weight loss, chest pain, body aches, joint swelling, muscle aches, shortness of breath, mood changes, visual or auditory hallucinations.  Objective:    General: Well Developed, well nourished, and in no acute distress.  Neuro: Alert and oriented x3, extra-ocular muscles intact, sensation grossly intact.  HEENT: Normocephalic, atraumatic, pupils equal round reactive to light, neck supple, no masses, no lymphadenopathy, thyroid nonpalpable.  Skin: Warm and dry, no rashes noted.  Cardiac: Regular rate and rhythm, no murmurs rubs or gallops.  Respiratory: Clear to auscultation bilaterally. Not using accessory muscles, speaking in full sentences.  Abdominal: Soft, nontender, nondistended, positive bowel sounds, no masses, no organomegaly. Well-healed 2cm scar below  umbilicus. Musculoskeletal: Shoulder, elbow, wrist, hip, knee, ankle stable, and with full range of motion.    Impression and Recommendations:    The patient was counselled, risk factors were discussed, anticipatory guidance given.  1. HTN: Well-controlled on lisinopril 10mg .  Currently re-starting Heart Healthy Diet.  -Continue lisinopril 10mg .  Can d/c if BP readings low once begins weight loss.   2. GERD: Well-controlled on Omeprazole -Continue Omeprazole daily   3. Hyperlipidemia: Cholesterol elevated to 242, LDL 156 last week.  Currently on simvastatin 40mg .  -Start Rosuvastatin.  Discontinue Simvastatin.   4. Health maintenance -HbA1c, TSH, lipid panel, CBC, BMP drawn last week -Flu shot today -Tetanus shot today -Last colonoscopy one year ago, was normal

## 2016-04-14 NOTE — Assessment & Plan Note (Signed)
Up-to-date on colonoscopy, needs tetanus and flu shots today.

## 2016-04-14 NOTE — Assessment & Plan Note (Addendum)
Discontinue simvastatin, starting Crestor. Recheck in 3 months. He will need a rectal exam for cancer screening for insurance purposes at the next visit.

## 2016-07-21 ENCOUNTER — Ambulatory Visit: Payer: BLUE CROSS/BLUE SHIELD | Admitting: Sports Medicine

## 2016-07-29 ENCOUNTER — Encounter: Payer: Self-pay | Admitting: Sports Medicine

## 2016-07-29 DIAGNOSIS — E7800 Pure hypercholesterolemia, unspecified: Secondary | ICD-10-CM

## 2016-07-29 DIAGNOSIS — Z0000 Encounter for general adult medical examination without abnormal findings: Secondary | ICD-10-CM

## 2016-08-03 DIAGNOSIS — E7800 Pure hypercholesterolemia, unspecified: Secondary | ICD-10-CM | POA: Diagnosis not present

## 2016-08-03 DIAGNOSIS — Z0000 Encounter for general adult medical examination without abnormal findings: Secondary | ICD-10-CM | POA: Diagnosis not present

## 2016-08-03 LAB — COMPREHENSIVE METABOLIC PANEL WITH GFR
Albumin: 4.4 g/dL (ref 3.6–5.1)
CO2: 23 mmol/L (ref 20–31)
Calcium: 10.1 mg/dL (ref 8.6–10.3)
Chloride: 107 mmol/L (ref 98–110)
Potassium: 4.9 mmol/L (ref 3.5–5.3)
Sodium: 141 mmol/L (ref 135–146)
Total Protein: 6.9 g/dL (ref 6.1–8.1)

## 2016-08-03 LAB — CBC
HCT: 44.6 % (ref 38.5–50.0)
Hemoglobin: 14.5 g/dL (ref 13.2–17.1)
MCH: 31.6 pg (ref 27.0–33.0)
MCHC: 32.5 g/dL (ref 32.0–36.0)
MCV: 97.2 fL (ref 80.0–100.0)
MPV: 9.2 fL (ref 7.5–12.5)
Platelets: 252 10*3/uL (ref 140–400)
RBC: 4.59 MIL/uL (ref 4.20–5.80)
RDW: 13 % (ref 11.0–15.0)
WBC: 9.6 10*3/uL (ref 3.8–10.8)

## 2016-08-03 LAB — COMPREHENSIVE METABOLIC PANEL
ALT: 22 U/L (ref 9–46)
AST: 25 U/L (ref 10–35)
Alkaline Phosphatase: 67 U/L (ref 40–115)
BUN: 27 mg/dL — ABNORMAL HIGH (ref 7–25)
Creat: 1.02 mg/dL (ref 0.70–1.33)
Glucose, Bld: 104 mg/dL — ABNORMAL HIGH (ref 65–99)
Total Bilirubin: 0.3 mg/dL (ref 0.2–1.2)

## 2016-08-03 LAB — TSH: TSH: 1.21 mIU/L (ref 0.40–4.50)

## 2016-08-03 LAB — LIPID PANEL
Cholesterol: 171 mg/dL (ref <200)
HDL: 62 mg/dL (ref >40)
LDL Cholesterol: 91 mg/dL (ref <100)
Total CHOL/HDL Ratio: 2.8 ratio (ref <5.0)
Triglycerides: 88 mg/dL (ref <150)
VLDL: 18 mg/dL (ref <30)

## 2016-08-03 LAB — HEMOGLOBIN A1C
Hgb A1c MFr Bld: 5 % (ref <5.7)
Mean Plasma Glucose: 97 mg/dL

## 2016-08-03 LAB — HIV ANTIBODY (ROUTINE TESTING W REFLEX): HIV 1&2 Ab, 4th Generation: NONREACTIVE

## 2016-08-03 LAB — HEPATITIS C ANTIBODY: HCV Ab: NEGATIVE

## 2016-08-04 LAB — VITAMIN D 25 HYDROXY (VIT D DEFICIENCY, FRACTURES): Vit D, 25-Hydroxy: 51 ng/mL (ref 30–100)

## 2016-08-05 ENCOUNTER — Encounter: Payer: Self-pay | Admitting: Sports Medicine

## 2016-08-05 ENCOUNTER — Ambulatory Visit (INDEPENDENT_AMBULATORY_CARE_PROVIDER_SITE_OTHER): Payer: BLUE CROSS/BLUE SHIELD

## 2016-08-05 ENCOUNTER — Ambulatory Visit (INDEPENDENT_AMBULATORY_CARE_PROVIDER_SITE_OTHER): Payer: BLUE CROSS/BLUE SHIELD | Admitting: Sports Medicine

## 2016-08-05 DIAGNOSIS — M4602 Spinal enthesopathy, cervical region: Secondary | ICD-10-CM | POA: Diagnosis not present

## 2016-08-05 DIAGNOSIS — M4322 Fusion of spine, cervical region: Secondary | ICD-10-CM

## 2016-08-05 DIAGNOSIS — E785 Hyperlipidemia, unspecified: Secondary | ICD-10-CM | POA: Diagnosis not present

## 2016-08-05 DIAGNOSIS — M5030 Other cervical disc degeneration, unspecified cervical region: Secondary | ICD-10-CM | POA: Diagnosis not present

## 2016-08-05 DIAGNOSIS — I10 Essential (primary) hypertension: Secondary | ICD-10-CM

## 2016-08-05 DIAGNOSIS — M50321 Other cervical disc degeneration at C4-C5 level: Secondary | ICD-10-CM | POA: Diagnosis not present

## 2016-08-05 NOTE — Progress Notes (Signed)
  Subjective:    CC: Follow-up  HPI: This is a pleasant 56 year old male, here for follow-up of a couple of things.  Hypertension: Well controlled  Cervical degenerative disc disease: History of surgical fusion as well as congenital fusion, having occasional catching sensations with extension and left-sided rotation, nothing radicular, only minimal pain. He once a diagnosis more than anything else. He has failed greater than 6 weeks of physician directed rehabilitation.  Hyperlipidemia: Now beautifully controlled on Crestor.  Past medical history:  Negative.  See flowsheet/record as well for more information.  Surgical history: Negative.  See flowsheet/record as well for more information.  Family history: Negative.  See flowsheet/record as well for more information.  Social history: Negative.  See flowsheet/record as well for more information.  Allergies, and medications have been entered into the medical record, reviewed, and no changes needed.   Review of Systems: No fevers, chills, night sweats, weight loss, chest pain, or shortness of breath.   Objective:    General: Well Developed, well nourished, and in no acute distress.  Neuro: Alert and oriented x3, extra-ocular muscles intact, sensation grossly intact.  HEENT: Normocephalic, atraumatic, pupils equal round reactive to light, neck supple, no masses, no lymphadenopathy, thyroid nonpalpable.  Skin: Warm and dry, no rashes. Cardiac: Regular rate and rhythm, no murmurs rubs or gallops, no lower extremity edema.  Respiratory: Clear to auscultation bilaterally. Not using accessory muscles, speaking in full sentences. Neck: Negative spurling's Full neck range of motion Grip strength and sensation normal in bilateral hands Strength good C4 to T1 distribution No sensory change to C4 to T1 Reflexes normal  Impression and Recommendations:    Essential hypertension, benign Doing well, no changes.  DDD (degenerative disc  disease), cervical Both surgical and congenital fusion, with what sounds like adjacent level disease and facet arthritis. We are going get C-spine x-rays, as well as flexion/extension views, his main concern is if there is anything structurally wrong, his pain is minimal.  Hyperlipidemia Under beautiful control on Crestor.

## 2016-08-05 NOTE — Assessment & Plan Note (Signed)
Doing well, no changes 

## 2016-08-05 NOTE — Assessment & Plan Note (Signed)
Both surgical and congenital fusion, with what sounds like adjacent level disease and facet arthritis. We are going get C-spine x-rays, as well as flexion/extension views, his main concern is if there is anything structurally wrong, his pain is minimal.

## 2016-08-05 NOTE — Assessment & Plan Note (Signed)
Under beautiful control on Crestor.

## 2017-03-30 ENCOUNTER — Other Ambulatory Visit: Payer: Self-pay | Admitting: Sports Medicine

## 2017-03-30 DIAGNOSIS — E785 Hyperlipidemia, unspecified: Secondary | ICD-10-CM

## 2017-04-02 ENCOUNTER — Encounter: Payer: Self-pay | Admitting: Sports Medicine

## 2017-04-02 ENCOUNTER — Ambulatory Visit (INDEPENDENT_AMBULATORY_CARE_PROVIDER_SITE_OTHER): Payer: BLUE CROSS/BLUE SHIELD | Admitting: Sports Medicine

## 2017-04-02 VITALS — BP 128/80 | HR 71 | Resp 16 | Wt 210.0 lb

## 2017-04-02 DIAGNOSIS — Z0000 Encounter for general adult medical examination without abnormal findings: Secondary | ICD-10-CM

## 2017-04-02 DIAGNOSIS — Z23 Encounter for immunization: Secondary | ICD-10-CM | POA: Diagnosis not present

## 2017-04-02 DIAGNOSIS — L989 Disorder of the skin and subcutaneous tissue, unspecified: Secondary | ICD-10-CM

## 2017-04-02 DIAGNOSIS — L570 Actinic keratosis: Secondary | ICD-10-CM | POA: Insufficient documentation

## 2017-04-02 DIAGNOSIS — S0531XS Ocular laceration without prolapse or loss of intraocular tissue, right eye, sequela: Secondary | ICD-10-CM

## 2017-04-02 DIAGNOSIS — E782 Mixed hyperlipidemia: Secondary | ICD-10-CM

## 2017-04-02 DIAGNOSIS — I10 Essential (primary) hypertension: Secondary | ICD-10-CM

## 2017-04-02 NOTE — Assessment & Plan Note (Signed)
Well controlled, no changes 

## 2017-04-02 NOTE — Assessment & Plan Note (Signed)
This is likely an actinic keratosis or squamous cell carcinoma. He will return for excisional biopsy, typically I would shave these but it is buried within his tattoo so difficult to tell the exact features of it, so to be safe we will do an excisional biopsy, I think we can do this in 15 minutes.

## 2017-04-02 NOTE — Assessment & Plan Note (Signed)
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

## 2017-04-02 NOTE — Assessment & Plan Note (Signed)
Annual physical as above, we did check blood work in January, also normal. Flu shot today.

## 2017-04-02 NOTE — Progress Notes (Signed)
  Subjective:    CC: Annual physical  HPI:  This is a pleasant 56 year old male, he is healthy, he has no complaint with the exception of a skin lesion on his left dorsal forearm. It is buried within his tattoo. Has been growing slowly over time. He is due for a flu shot, up-to-date on all other screening measures  Hypertension: Well controlled  Hyperlipidemia: Well controlled  Past medical history:  Negative.  See flowsheet/record as well for more information.  Surgical history: Negative.  See flowsheet/record as well for more information.  Family history: Negative.  See flowsheet/record as well for more information.  Social history: Negative.  See flowsheet/record as well for more information.  Allergies, and medications have been entered into the medical record, reviewed, and no changes needed.    Review of Systems: No headache, visual changes, nausea, vomiting, diarrhea, constipation, dizziness, abdominal pain, skin rash, fevers, chills, night sweats, swollen lymph nodes, weight loss, chest pain, body aches, joint swelling, muscle aches, shortness of breath, mood changes, visual or auditory hallucinations.  Objective:    General: Well Developed, well nourished, and in no acute distress.  Neuro: Alert and oriented x3, extra-ocular muscles intact, sensation grossly intact. Cranial nerves II through XII are intact, motor, sensory, and coordinative functions are all intact. HEENT: Normocephalic, atraumatic, left pupil is round and reactive to light, right pupil displays anisocoria, with fixed mid dilation, and trauma at the iris due to history of a globe rupture, neck supple, no masses, no lymphadenopathy, thyroid nonpalpable. Oropharynx, nasopharynx, external ear canals are unremarkable. Skin: Warm and dry, no rashes noted. There is a papular, rough lesion over the left dorsal distal forearm, buried within the tattoo so difficult to determine exact features. Cardiac: Regular rate and  rhythm, no murmurs rubs or gallops.  Respiratory: Clear to auscultation bilaterally. Not using accessory muscles, speaking in full sentences.  Abdominal: Soft, nontender, nondistended, positive bowel sounds, no masses, no organomegaly.  Musculoskeletal: Shoulder, elbow, wrist, hip, knee, ankle stable, and with full range of motion. Rectal: Good tone, smooth prostate, Hemoccult negative  Impression and Recommendations:    The patient was counselled, risk factors were discussed, anticipatory guidance given.  Annual physical exam Annual physical as above, we did check blood work in January, also normal. Flu shot today.   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   Skin lesion of left arm This is likely an actinic keratosis or squamous cell carcinoma. He will return for excisional biopsy, typically I would shave these but it is buried within his tattoo so difficult to tell the exact features of it, so to be safe we will do an excisional biopsy, I think we can do this in 15 minutes.  ___________________________________________ Gwen Her. Dianah Field, M.D., ABFM., CAQSM. Primary Care and [place] Instructor of [place] [employer] [employer] [employer]

## 2017-04-07 ENCOUNTER — Encounter: Payer: Self-pay | Admitting: Sports Medicine

## 2017-04-09 ENCOUNTER — Ambulatory Visit: Payer: BLUE CROSS/BLUE SHIELD | Admitting: Sports Medicine

## 2017-04-16 ENCOUNTER — Encounter: Payer: Self-pay | Admitting: Sports Medicine

## 2017-04-16 ENCOUNTER — Ambulatory Visit (INDEPENDENT_AMBULATORY_CARE_PROVIDER_SITE_OTHER): Payer: BLUE CROSS/BLUE SHIELD | Admitting: Sports Medicine

## 2017-04-16 DIAGNOSIS — L818 Other specified disorders of pigmentation: Secondary | ICD-10-CM | POA: Diagnosis not present

## 2017-04-16 DIAGNOSIS — L989 Disorder of the skin and subcutaneous tissue, unspecified: Secondary | ICD-10-CM | POA: Diagnosis not present

## 2017-04-16 DIAGNOSIS — L281 Prurigo nodularis: Secondary | ICD-10-CM | POA: Diagnosis not present

## 2017-04-16 DIAGNOSIS — L570 Actinic keratosis: Secondary | ICD-10-CM | POA: Diagnosis not present

## 2017-04-16 NOTE — Addendum Note (Signed)
Addended by: Elizabeth Sauer on: 04/16/2017 11:09 AM   Modules accepted: Orders

## 2017-04-16 NOTE — Progress Notes (Signed)
   Procedure:  Excision of 1cm left dorsal forearm skin lesion Risks, benefits, and alternatives explained and consent obtained. Time out conducted. Surface prepped with alcohol. 5cc lidocaine with epinephine infiltrated in a field block. Adequate anesthesia ensured. Area prepped and draped in a sterile fashion. Excision performed with: Using a #15 blade I made a longitudinal elliptical incision around the lesion, removing an en bloc down to the subcutaneous tissues and then placed #3, 3-0 Ethilon simple interrupted sutures to close the incision. A localizing suture was placed in the proximal pole of the specimen Hemostasis achieved. Pt stable.

## 2017-04-16 NOTE — Assessment & Plan Note (Signed)
Excisional biopsy as above, return in one week for a wound check and suture removal.

## 2017-04-23 ENCOUNTER — Encounter: Payer: Self-pay | Admitting: Sports Medicine

## 2017-04-23 ENCOUNTER — Ambulatory Visit (INDEPENDENT_AMBULATORY_CARE_PROVIDER_SITE_OTHER): Payer: BLUE CROSS/BLUE SHIELD | Admitting: Sports Medicine

## 2017-04-23 ENCOUNTER — Other Ambulatory Visit: Payer: Self-pay | Admitting: Orthopedic Surgery

## 2017-04-23 DIAGNOSIS — L570 Actinic keratosis: Secondary | ICD-10-CM

## 2017-04-23 NOTE — Progress Notes (Signed)
  Subjective: 1 week post excision of a actinic keratosis. Doing well.   Objective: General: Well-developed, well-nourished, and in no acute distress. Incision: Clean, dry, intact, sutures were removed, a bit of Dermabond applied to reinforce the incision.  Assessment/plan:   Actinic keratosis Dorsal left forearm, margins free. Sutures removed today, I did apply bid of Dermabond to reinforce the wound, return as needed.  ___________________________________________ Gwen Her. Dianah Field, M.D., ABFM., CAQSM. Primary Care and [place] In[employer] of [place] [employer] [facility]

## 2017-04-23 NOTE — Assessment & Plan Note (Signed)
Dorsal left forearm, margins free. Sutures removed today, I did apply bid of Dermabond to reinforce the wound, return as needed.

## 2017-04-27 ENCOUNTER — Other Ambulatory Visit: Payer: Self-pay | Admitting: Sports Medicine

## 2017-04-27 ENCOUNTER — Encounter: Payer: Self-pay | Admitting: Sports Medicine

## 2017-04-27 DIAGNOSIS — I10 Essential (primary) hypertension: Secondary | ICD-10-CM

## 2017-04-28 ENCOUNTER — Encounter (HOSPITAL_BASED_OUTPATIENT_CLINIC_OR_DEPARTMENT_OTHER): Payer: Self-pay | Admitting: *Deleted

## 2017-04-28 MED ORDER — LISINOPRIL 20 MG PO TABS: 10.0000 mg | ORAL_TABLET | Freq: Every day | ORAL | 3 refills | Status: DC

## 2017-04-29 ENCOUNTER — Other Ambulatory Visit: Payer: Self-pay | Admitting: Sports Medicine

## 2017-04-29 DIAGNOSIS — I10 Essential (primary) hypertension: Secondary | ICD-10-CM

## 2017-05-03 ENCOUNTER — Other Ambulatory Visit: Payer: Self-pay

## 2017-05-03 ENCOUNTER — Encounter (HOSPITAL_BASED_OUTPATIENT_CLINIC_OR_DEPARTMENT_OTHER)
Admission: RE | Admit: 2017-05-03 | Discharge: 2017-05-03 | Disposition: A | Discharge status: Home / Self Care | Payer: Worker's Compensation | Source: Ambulatory Visit | Attending: Orthopedic Surgery | Admitting: Orthopedic Surgery

## 2017-05-03 DIAGNOSIS — I10 Essential (primary) hypertension: Secondary | ICD-10-CM | POA: Diagnosis not present

## 2017-05-03 DIAGNOSIS — Z01818 Encounter for other preprocedural examination: Secondary | ICD-10-CM | POA: Diagnosis not present

## 2017-05-03 DIAGNOSIS — L989 Disorder of the skin and subcutaneous tissue, unspecified: Secondary | ICD-10-CM | POA: Insufficient documentation

## 2017-05-03 DIAGNOSIS — X58XXXS Exposure to other specified factors, sequela: Secondary | ICD-10-CM | POA: Insufficient documentation

## 2017-05-03 DIAGNOSIS — S0531XS Ocular laceration without prolapse or loss of intraocular tissue, right eye, sequela: Secondary | ICD-10-CM | POA: Diagnosis not present

## 2017-05-03 DIAGNOSIS — E785 Hyperlipidemia, unspecified: Secondary | ICD-10-CM | POA: Diagnosis not present

## 2017-05-04 ENCOUNTER — Ambulatory Visit (HOSPITAL_BASED_OUTPATIENT_CLINIC_OR_DEPARTMENT_OTHER): Payer: Worker's Compensation | Admitting: Anesthesiology

## 2017-05-04 ENCOUNTER — Encounter (HOSPITAL_BASED_OUTPATIENT_CLINIC_OR_DEPARTMENT_OTHER): Payer: Self-pay

## 2017-05-04 ENCOUNTER — Encounter (HOSPITAL_BASED_OUTPATIENT_CLINIC_OR_DEPARTMENT_OTHER)
Admission: RE | Disposition: A | Discharge status: Home / Self Care | Payer: Self-pay | Source: Ambulatory Visit | Attending: Orthopedic Surgery

## 2017-05-04 ENCOUNTER — Ambulatory Visit (HOSPITAL_BASED_OUTPATIENT_CLINIC_OR_DEPARTMENT_OTHER)
Admission: RE | Admit: 2017-05-04 | Discharge: 2017-05-04 | Disposition: A | Discharge status: Home / Self Care | Payer: Worker's Compensation | Source: Ambulatory Visit | Attending: Orthopedic Surgery | Admitting: Orthopedic Surgery

## 2017-05-04 DIAGNOSIS — M659 Synovitis and tenosynovitis, unspecified: Secondary | ICD-10-CM | POA: Insufficient documentation

## 2017-05-04 DIAGNOSIS — Z87891 Personal history of nicotine dependence: Secondary | ICD-10-CM | POA: Insufficient documentation

## 2017-05-04 DIAGNOSIS — Z79899 Other long term (current) drug therapy: Secondary | ICD-10-CM | POA: Diagnosis not present

## 2017-05-04 DIAGNOSIS — I10 Essential (primary) hypertension: Secondary | ICD-10-CM | POA: Diagnosis not present

## 2017-05-04 DIAGNOSIS — K219 Gastro-esophageal reflux disease without esophagitis: Secondary | ICD-10-CM | POA: Insufficient documentation

## 2017-05-04 DIAGNOSIS — E785 Hyperlipidemia, unspecified: Secondary | ICD-10-CM | POA: Diagnosis not present

## 2017-05-04 DIAGNOSIS — Z96642 Presence of left artificial hip joint: Secondary | ICD-10-CM | POA: Insufficient documentation

## 2017-05-04 DIAGNOSIS — M25532 Pain in left wrist: Secondary | ICD-10-CM | POA: Diagnosis not present

## 2017-05-04 DIAGNOSIS — M779 Enthesopathy, unspecified: Secondary | ICD-10-CM | POA: Diagnosis not present

## 2017-05-04 DIAGNOSIS — M65842 Other synovitis and tenosynovitis, left hand: Secondary | ICD-10-CM | POA: Diagnosis not present

## 2017-05-04 HISTORY — PX: TENOSYNOVECTOMY: SHX6110

## 2017-05-04 HISTORY — PX: WRIST ARTHROSCOPY WITH DEBRIDEMENT: SHX6194

## 2017-05-04 SURGERY — TENOSYNOVECTOMY
Anesthesia: Monitor Anesthesia Care | Site: Wrist | Laterality: Left

## 2017-05-04 MED ORDER — SUCCINYLCHOLINE CHLORIDE 200 MG/10ML IV SOSY
PREFILLED_SYRINGE | INTRAVENOUS | Status: AC
  Filled 2017-05-04: qty 10

## 2017-05-04 MED ORDER — FENTANYL CITRATE (PF) 100 MCG/2ML IJ SOLN
50.0000 ug | INTRAMUSCULAR | Status: DC | PRN
  Administered 2017-05-04 (×2): 100 ug via INTRAVENOUS

## 2017-05-04 MED ORDER — CHLORHEXIDINE GLUCONATE 4 % EX LIQD: 60.0000 mL | Freq: Once | CUTANEOUS | Status: DC

## 2017-05-04 MED ORDER — SCOPOLAMINE 1 MG/3DAYS TD PT72: 1.0000 | MEDICATED_PATCH | Freq: Once | TRANSDERMAL | Status: DC | PRN

## 2017-05-04 MED ORDER — ROPIVACAINE HCL 5 MG/ML IJ SOLN
INTRAMUSCULAR | Status: DC | PRN
  Administered 2017-05-04: 30 mL via PERINEURAL

## 2017-05-04 MED ORDER — DEXAMETHASONE SODIUM PHOSPHATE 10 MG/ML IJ SOLN
INTRAMUSCULAR | Status: DC | PRN
  Administered 2017-05-04: 10 mg via INTRAVENOUS

## 2017-05-04 MED ORDER — ONDANSETRON HCL 4 MG/2ML IJ SOLN
INTRAMUSCULAR | Status: AC
  Filled 2017-05-04: qty 2

## 2017-05-04 MED ORDER — FENTANYL CITRATE (PF) 100 MCG/2ML IJ SOLN
INTRAMUSCULAR | Status: AC
  Filled 2017-05-04: qty 2

## 2017-05-04 MED ORDER — ONDANSETRON HCL 4 MG/2ML IJ SOLN
INTRAMUSCULAR | Status: DC | PRN
  Administered 2017-05-04: 4 mg via INTRAVENOUS

## 2017-05-04 MED ORDER — EPHEDRINE 5 MG/ML INJ
INTRAVENOUS | Status: AC
  Filled 2017-05-04: qty 10

## 2017-05-04 MED ORDER — MIDAZOLAM HCL 2 MG/2ML IJ SOLN
1.0000 mg | INTRAMUSCULAR | Status: DC | PRN
Start: 2017-05-04 — End: 2017-05-04
  Administered 2017-05-04 (×2): 2 mg via INTRAVENOUS

## 2017-05-04 MED ORDER — MEPERIDINE HCL 25 MG/ML IJ SOLN: 6.2500 mg | INTRAMUSCULAR | Status: DC | PRN

## 2017-05-04 MED ORDER — MIDAZOLAM HCL 2 MG/2ML IJ SOLN
INTRAMUSCULAR | Status: AC
  Filled 2017-05-04: qty 2

## 2017-05-04 MED ORDER — HYDROCODONE-ACETAMINOPHEN 5-325 MG PO TABS: 1.0000 | ORAL_TABLET | Freq: Four times a day (QID) | ORAL | 0 refills | Status: DC | PRN

## 2017-05-04 MED ORDER — LIDOCAINE 2% (20 MG/ML) 5 ML SYRINGE
INTRAMUSCULAR | Status: AC
  Filled 2017-05-04: qty 5

## 2017-05-04 MED ORDER — LACTATED RINGERS IV SOLN
INTRAVENOUS | Status: DC
  Administered 2017-05-04 (×2): via INTRAVENOUS

## 2017-05-04 MED ORDER — METOCLOPRAMIDE HCL 5 MG/ML IJ SOLN: 10.0000 mg | Freq: Once | INTRAMUSCULAR | Status: DC | PRN

## 2017-05-04 MED ORDER — SODIUM CHLORIDE 0.9 % IR SOLN
Status: DC | PRN
  Administered 2017-05-04: 1000 mL

## 2017-05-04 MED ORDER — PROPOFOL 10 MG/ML IV BOLUS
INTRAVENOUS | Status: DC | PRN
  Administered 2017-05-04: 200 mg via INTRAVENOUS

## 2017-05-04 MED ORDER — VANCOMYCIN HCL IN DEXTROSE 1-5 GM/200ML-% IV SOLN
1000.0000 mg | INTRAVENOUS | Status: AC
  Administered 2017-05-04 (×2): 1000 mg via INTRAVENOUS

## 2017-05-04 MED ORDER — LACTATED RINGERS IV SOLN
INTRAVENOUS | Status: DC
Start: 2017-05-04 — End: 2017-05-04

## 2017-05-04 MED ORDER — FENTANYL CITRATE (PF) 100 MCG/2ML IJ SOLN: 25.0000 ug | INTRAMUSCULAR | Status: DC | PRN

## 2017-05-04 MED ORDER — PHENYLEPHRINE 40 MCG/ML (10ML) SYRINGE FOR IV PUSH (FOR BLOOD PRESSURE SUPPORT)
PREFILLED_SYRINGE | INTRAVENOUS | Status: AC
  Filled 2017-05-04: qty 10

## 2017-05-04 MED ORDER — VANCOMYCIN HCL IN DEXTROSE 1-5 GM/200ML-% IV SOLN
INTRAVENOUS | Status: AC
  Filled 2017-05-04: qty 200

## 2017-05-04 SURGICAL SUPPLY — 68 items
BAG DECANTER FOR FLEXI CONT (MISCELLANEOUS) IMPLANT
BLADE MINI RND TIP GREEN BEAV (BLADE) IMPLANT
BLADE SURG 15 STRL LF DISP TIS (BLADE) ×1 IMPLANT
BLADE SURG 15 STRL SS (BLADE) ×1
BNDG COHESIVE 3X5 TAN STRL LF (GAUZE/BANDAGES/DRESSINGS) ×2 IMPLANT
BNDG ESMARK 4X9 LF (GAUZE/BANDAGES/DRESSINGS) ×2 IMPLANT
BNDG GAUZE ELAST 4 BULKY (GAUZE/BANDAGES/DRESSINGS) ×2 IMPLANT
BUR FULL RADIUS 2.0 (BURR) ×2 IMPLANT
CANISTER SUCT 1200ML W/VALVE (MISCELLANEOUS) ×2 IMPLANT
CHLORAPREP W/TINT 26ML (MISCELLANEOUS) ×2 IMPLANT
CORD BIPOLAR FORCEPS 12FT (ELECTRODE) ×2 IMPLANT
COVER BACK TABLE 60X90IN (DRAPES) ×2 IMPLANT
COVER MAYO STAND STRL (DRAPES) ×2 IMPLANT
CUFF TOURNIQUET SINGLE 18IN (TOURNIQUET CUFF) ×2 IMPLANT
DECANTER SPIKE VIAL GLASS SM (MISCELLANEOUS) IMPLANT
DRAPE EXTREMITY T 121X128X90 (DRAPE) ×2 IMPLANT
DRAPE IMP U-DRAPE 54X76 (DRAPES) ×2 IMPLANT
DRAPE SURG 17X23 STRL (DRAPES) ×2 IMPLANT
DRSG TEGADERM 2-3/8X2-3/4 SM (GAUZE/BANDAGES/DRESSINGS) ×2 IMPLANT
GAUZE SPONGE 4X4 12PLY STRL (GAUZE/BANDAGES/DRESSINGS) ×2 IMPLANT
GAUZE XEROFORM 1X8 LF (GAUZE/BANDAGES/DRESSINGS) ×2 IMPLANT
GLOVE BIOGEL PI IND STRL 7.0 (GLOVE) ×2 IMPLANT
GLOVE BIOGEL PI IND STRL 8.5 (GLOVE) ×1 IMPLANT
GLOVE BIOGEL PI INDICATOR 7.0 (GLOVE) ×2
GLOVE BIOGEL PI INDICATOR 8.5 (GLOVE) ×1
GLOVE ECLIPSE 6.5 STRL STRAW (GLOVE) ×2 IMPLANT
GLOVE SURG ORTHO 8.0 STRL STRW (GLOVE) ×2 IMPLANT
GOWN STRL REUS W/ TWL LRG LVL3 (GOWN DISPOSABLE) ×1 IMPLANT
GOWN STRL REUS W/TWL LRG LVL3 (GOWN DISPOSABLE) ×1
GOWN STRL REUS W/TWL XL LVL3 (GOWN DISPOSABLE) ×2 IMPLANT
IV NS IRRIG 3000ML ARTHROMATIC (IV SOLUTION) ×2 IMPLANT
IV SET EXT 30 76VOL 4 MALE LL (IV SETS) ×2 IMPLANT
LOOP VESSEL MAXI BLUE (MISCELLANEOUS) IMPLANT
NDL SAFETY ECLIPSE 18X1.5 (NEEDLE) ×1 IMPLANT
NEEDLE HYPO 18GX1.5 SHARP (NEEDLE) ×1
NEEDLE HYPO 22GX1.5 SAFETY (NEEDLE) ×2 IMPLANT
NEEDLE KEITH (NEEDLE) IMPLANT
NEEDLE PRECISIONGLIDE 27X1.5 (NEEDLE) IMPLANT
NS IRRIG 1000ML POUR BTL (IV SOLUTION) IMPLANT
PACK BASIN DAY SURGERY FS (CUSTOM PROCEDURE TRAY) ×2 IMPLANT
PAD CAST 3X4 CTTN HI CHSV (CAST SUPPLIES) ×1 IMPLANT
PADDING CAST ABS 4INX4YD NS (CAST SUPPLIES) ×1
PADDING CAST ABS COTTON 4X4 ST (CAST SUPPLIES) ×1 IMPLANT
PADDING CAST COTTON 3X4 STRL (CAST SUPPLIES) ×1
ROUTER HOODED VORTEX 2.9MM (BLADE) ×2 IMPLANT
SET ARTHROSCOPY TUBING (MISCELLANEOUS) ×1
SET ARTHROSCOPY TUBING LN (MISCELLANEOUS) ×1 IMPLANT
SLEEVE SCD COMPRESS KNEE MED (MISCELLANEOUS) ×2 IMPLANT
SLING ARM FOAM STRAP LRG (SOFTGOODS) ×2 IMPLANT
SPLINT PLASTER CAST XFAST 3X15 (CAST SUPPLIES) ×5 IMPLANT
SPLINT PLASTER XTRA FASTSET 3X (CAST SUPPLIES) ×5
STOCKINETTE 4X48 STRL (DRAPES) ×2 IMPLANT
SUT ETHILON 4 0 PS 2 18 (SUTURE) ×2 IMPLANT
SUT MERSILENE 4 0 P 3 (SUTURE) IMPLANT
SUT SILK 4 0 PS 2 (SUTURE) IMPLANT
SUT VIC AB 4-0 P-3 18XBRD (SUTURE) IMPLANT
SUT VIC AB 4-0 P3 18 (SUTURE)
SUT VICRYL 4-0 PS2 18IN ABS (SUTURE) IMPLANT
SWAB COLLECTION DEVICE MRSA (MISCELLANEOUS) IMPLANT
SWAB CULTURE ESWAB REG 1ML (MISCELLANEOUS) IMPLANT
SYR BULB 3OZ (MISCELLANEOUS) ×2 IMPLANT
SYR CONTROL 10ML LL (SYRINGE) ×2 IMPLANT
TOWEL OR 17X24 6PK STRL BLUE (TOWEL DISPOSABLE) ×2 IMPLANT
TOWEL OR NON WOVEN STRL DISP B (DISPOSABLE) ×2 IMPLANT
TUBE FEEDING ENTERAL 5FR 16IN (TUBING) IMPLANT
UNDERPAD 30X30 (UNDERPADS AND DIAPERS) IMPLANT
WAND SHORT BEVEL W/CORD (SURGICAL WAND) ×2 IMPLANT
WATER STERILE IRR 1000ML POUR (IV SOLUTION) ×2 IMPLANT

## 2017-05-04 NOTE — Brief Op Note (Signed)
05/04/2017  11:07 AM  PATIENT:  Early Osmond  56 y.o. male  PRE-OPERATIVE DIAGNOSIS:  tenosynovitis ECU Left  N77.9  POST-OPERATIVE DIAGNOSIS:  tenosynovitis ECU Left  N77.9, arthritis of hammate  PROCEDURE:  Procedure(s): LEFT WRIST TENOSYNOVECTOMY EXTENSOR CARPI ULNARIS (Left) WRIST ARTHROSCOPY WITH DEBRIDEMENT OF HAMMATE (Left)  SURGEON:  Surgeon(s) and Role:    Daryll Brod, MD - Primary  PHYSICIAN ASSISTANT:   ASSISTANTS: none   ANESTHESIA:   regional and IV sedation  EBL:  5 mL   BLOOD ADMINISTERED:none  DRAINS: none   LOCAL MEDICATIONS USED:  NONE  SPECIMEN:  No Specimen  DISPOSITION OF SPECIMEN:  N/A  COUNTS:  YES  TOURNIQUET:   Total Tourniquet Time Documented: Upper Arm (Left) - 24 minutes Total: Upper Arm (Left) - 24 minutes   DICTATION: .Other Dictation: Dictation Number [id number]  PLAN OF CARE: dischatge to home after PACU  PATIENT DISPOSITION:  PACU - hemodynamically stable.

## 2017-05-04 NOTE — Discharge Instructions (Signed)
° °  ° ° ° °Hand Center Instructions °Hand Surgery ° °Wound Care: °Keep your hand elevated above the level of your heart.  Do not allow it to dangle by your side.  Keep the dressing dry and do not remove it unless your doctor advises you to do so.  He will usually change it at the time of your post-op visit.  Moving your fingers is advised to stimulate circulation but will depend on the site of your surgery.  If you have a splint applied, your doctor will advise you regarding movement. ° °Activity: °Do not drive or operate machinery today.  Rest today and then you may return to your normal activity and work as indicated by your physician. ° °Diet:  °Drink liquids today or eat a light diet.  You may resume a regular diet tomorrow.   ° °General expectations: °Pain for two to three days. °Fingers may become slightly swollen. ° °Call your doctor if any of the following occur: °Severe pain not relieved by pain medication. °Elevated temperature. °Dressing soaked with blood. °Inability to move fingers. °White or bluish color to fingers. ° ° °Regional Anesthesia Blocks ° °1. Numbness or the inability to move the "blocked" extremity may last from 3-48 hours after placement. The length of time depends on the medication injected and your individual response to the medication. If the numbness is not going away after 48 hours, call your surgeon. ° °2. The extremity that is blocked will need to be protected until the numbness is gone and the  Strength has returned. Because you cannot feel it, you will need to take extra care to avoid injury. Because it may be weak, you may have difficulty moving it or using it. You may not know what position it is in without looking at it while the block is in effect. ° °3. For blocks in the legs and feet, returning to weight bearing and walking needs to be done carefully. You will need to wait until the numbness is entirely gone and the strength has returned. You should be able to move your leg  and foot normally before you try and bear weight or walk. You will need someone to be with you when you first try to ensure you do not fall and possibly risk injury. ° °4. Bruising and tenderness at the needle site are common side effects and will resolve in a few days. ° °5. Persistent numbness or new problems with movement should be communicated to the surgeon or the [facility] ([phone])/ [facility] ([phone]). ° ° ° °Post Anesthesia Home Care Instructions ° °Activity: °Get plenty of rest for the remainder of the day. A responsible individual must stay with you for 24 hours following the procedure.  °For the next 24 hours, DO NOT: °-Drive a car °-Operate machinery °-Drink alcoholic beverages °-Take any medication unless instructed by your physician °-Make any legal decisions or sign important papers. ° °Meals: °Start with liquid foods such as gelatin or soup. Progress to regular foods as tolerated. Avoid greasy, spicy, heavy foods. If nausea and/or vomiting occur, drink only clear liquids until the nausea and/or vomiting subsides. Call your physician if vomiting continues. ° °Special Instructions/Symptoms: °Your throat may feel dry or sore from the anesthesia or the breathing tube placed in your throat during surgery. If this causes discomfort, gargle with warm salt water. The discomfort should disappear within 24 hours. ° °If you had a scopolamine patch placed behind your ear for   the management of post- operative nausea and/or vomiting: ° °1. The medication in the patch is effective for 72 hours, after which it should be removed.  Wrap patch in a tissue and discard in the trash. Wash hands thoroughly with soap and water. °2. You may remove the patch earlier than 72 hours if you experience unpleasant side effects which may include dry mouth, dizziness or visual disturbances. °3. Avoid touching the patch. Wash your hands with soap and water after contact with the patch. °  ° ° °

## 2017-05-04 NOTE — Anesthesia Preprocedure Evaluation (Addendum)
Anesthesia Evaluation  Patient identified by MRN, date of birth, ID band Patient awake    Reviewed: Allergy & Precautions, NPO status , Patient's Chart, lab work & pertinent test results  Airway Mallampati: II  TM Distance: >3 FB Neck ROM: Full    Dental no notable dental hx.    Pulmonary neg pulmonary ROS, former smoker,    Pulmonary exam normal breath sounds clear to auscultation       Cardiovascular hypertension, Pt. on medications negative cardio ROS Normal cardiovascular exam Rhythm:Regular Rate:Normal     Neuro/Psych negative neurological ROS  negative psych ROS   GI/Hepatic Neg liver ROS, GERD  Medicated and Controlled,  Endo/Other  negative endocrine ROS  Renal/GU negative Renal ROS  negative genitourinary   Musculoskeletal negative musculoskeletal ROS (+)   Abdominal   Peds negative pediatric ROS (+)  Hematology negative hematology ROS (+)   Anesthesia Other Findings   Reproductive/Obstetrics negative OB ROS                             Anesthesia Physical Anesthesia Plan  ASA: II  Anesthesia Plan: General   Post-op Pain Management:  Regional for Post-op pain   Induction:   PONV Risk Score and Plan: 2 and Ondansetron and Treatment may vary due to age or medical condition  Airway Management Planned: LMA  Additional Equipment:   Intra-op Plan:   Post-operative Plan:   Informed Consent: I have reviewed the patients History and Physical, chart, labs and discussed the procedure including the risks, benefits and alternatives for the proposed anesthesia with the patient or authorized representative who has indicated his/her understanding and acceptance.   Dental advisory given  Plan Discussed with: CRNA  Anesthesia Plan Comments: (SCB)       Anesthesia Quick Evaluation

## 2017-05-04 NOTE — Anesthesia Procedure Notes (Addendum)
Anesthesia Regional Block: Supraclavicular block   Pre-Anesthetic Checklist: ,, timeout performed, Correct Patient, Correct Site, Correct Laterality, Correct Procedure, Correct Position, site marked, Risks and benefits discussed,  Surgical consent,  Pre-op evaluation,  At surgeon's request and post-op pain management  Laterality: Left and Upper  Prep: Maximum Sterile Barrier Precautions used, chloraprep       Needles:  Injection technique: Single-shot  Needle Type: Echogenic Stimulator Needle     Needle Length: 10cm      Additional Needles:   Procedures:,,,, ultrasound used (permanent image in chart),,,,  Narrative:  Start time: 05/04/2017 9:18 AM End time: 05/04/2017 9:28 AM Injection made incrementally with aspirations every 5 mL.  Performed by: Personally  Anesthesiologist: Montez Hageman  Additional Notes: Risks, benefits and alternative to block explained extensively.  Patient tolerated procedure well, without complications.

## 2017-05-04 NOTE — Transfer of Care (Signed)
Immediate Anesthesia Transfer of Care Note  Patient: John Benton  Procedure(s) Performed: LEFT WRIST TENOSYNOVECTOMY EXTENSOR CARPI ULNARIS (Left Wrist) WRIST ARTHROSCOPY WITH DEBRIDEMENT OF HAMMATE (Left Wrist)  Patient Location: PACU  Anesthesia Type:General  Level of Consciousness: awake, alert  and oriented  Airway & Oxygen Therapy: Patient Spontanous Breathing and Patient connected to face mask oxygen  Post-op Assessment: Report given to RN and Post -op Vital signs reviewed and stable  Post vital signs: Reviewed and stable  Last Vitals:  Vitals:   05/04/17 0930 05/04/17 0935  BP: 140/90   Pulse: (!) 56 (!) 55  Resp: 12 11  Temp:    SpO2: 100% 100%    Last Pain:  Vitals:   05/04/17 0825  TempSrc: Oral  PainSc: 3       Patients Stated Pain Goal: 1 (46/56/81 2751)  Complications: No apparent anesthesia complications

## 2017-05-04 NOTE — Op Note (Signed)
NAME:  John Benton, John Benton NO.:  [id number]  MEDICAL RECORD NO.:  [id number]  LOCATION:                                 FACILITY:  PHYSICIAN:  Daryll Brod, M.D.            DATE OF BIRTH:  DATE OF PROCEDURE:  05/04/2017 DATE OF DISCHARGE:                              OPERATIVE REPORT   PREOPERATIVE DIAGNOSES:  Extensor tenosynovitis, extensor carpi ulnaris, ulnar-sided wrist pain, left wrist.  POSTOPERATIVE DIAGNOSES:  Extensor carpi ulnaris tenosynovitis and hamate lunate impaction, left wrist.  PROCEDURE PERFORMED: 1. Arthroscopy with debridement and arthroplasty, proximal hamate. 2. Extensor tenosynovectomy Extensor carpi ulnaris opened, left wrist.  SURGEON:  Daryll Brod, M.D.  ANESTHESIA:  Supraclavicular block.  PLACE OF SURGERY:  [facility].  ANESTHESIOLOGIST:  Montez Hageman, MD.  HISTORY:  The patient is a 56 year old male who sustained a twisting injury to his left wrist.  This has not responded to conservative treatment.  MRI reveals a tenosynovitis of the extensor tendon of the extensor carpi ulnaris.  He is admitted for tenosynovectomy extensor tendon with arthroscopy of his left wrist.  Pre, peri and postoperative course have been discussed along with risks and complications.  He is aware that there is no guarantee to the surgery, the possibility of infection, recurrence of injury to arteries, nerves, tendons; incomplete relief of symptoms; dystrophy.  In the preoperative area, the patient is seen, the extremity was marked by both patient and surgeon.  The antibiotic was given.  DESCRIPTION OF PROCEDURE:  The patient was brought to the operating room after a supraclavicular block was carried out without difficulty in the preoperative area by Dr. Marcell Barlow.  A general anesthetic sedation was then given under the direction of the Anesthesia Department after he was placed in a supine position with the left arm free.  He was prepped  with ChloraPrep and draped.  A tourniquet placed high on the arm was inflated to 250 mmHg.  During the open procedure, an inflation of the tourniquet was not used for the arthroscopy portion.  A 3-minute dry time was allowed.  Time-out was taken confirming the patient and the procedure. The left limb was placed in the arc arthroscopy tower and 10 pounds of traction applied.  The joint inflated through the 3-4 portal. Transverse incision was made, deepened with a hemostat.  A blunt trocar was used to enter the joint.  The joint was inspected.  An irrigation catheter was placed in 6-U portal.  The scapholunate ligament was intact.  The articular surface of the distal radius and proximal row showed no erosive changes.  A very significant tenosynovitis was present on the ulnar aspect.  The triangular fibrocartilage complex was intact. A 4-5 portal was then opened after localization with a 22-gauge needle. Again, a transverse incision made and deepened with a hemostat.  A blunt trocar used to enter the joint.  A 2 mm full radius shaver was then inserted.  A synovectomy performed to the ulnar side.  A [employer]s ArthroWand was then inserted and a further debridement performed with the ArthroWand along with hemostasis.  The joint was inspected through  the 4-5 portal.  The lunotriquetral joint was intact.  The TFCC showed a normal trampoline effect.  A portals were then opened at the midcarpal joint on the ulnar side first and this was localized with a 22-gauge needle.  Transverse incision made and deepened with a hemostat.  A blunt trocar used to enter the joint.  The joint was inspected.  Irrigation catheter placed in the mid radial portal.  A very significant erosion of the proximal hamate was immediately noted.  There was no cartilage on the proximal surface.  The radial midcarpal portal was then opened using a transverse incision deepening with a hemostat.  Blunt trocar used to enter the  joint.  The scope was then introduced on the radial side. There was no changes to the articular surface of the distal scaphoid proximal capitate distal lunate.  A full radius shaver was then inserted.  Debridement of the loose cartilage along with a loose body in the midcarpal joint was then removed.  A 2.9 mm Vortex bur was then inserted and the proximal aspect of the denuded portion of the hamate was then debrided.  This was done with the bur and cleaned with the full radius shaver.  The instruments were removed.  The limb was exsanguinated with an Esmarch bandage after removal from the arc arthroscopy tower.  An incision was then made over the extensor carpi ulnaris tendon distally and proximal to the wrist joint carried down through subcutaneous tissue.  The dorsal sensory branch of the ulnar nerve was not seen in the wound.  The fascia was opened proximally and distally and a tenosynovectomy performed to the extensor carpi ulnaris tendon.  A moderate tenosynovitis was present distally.  The wounds were irrigated.  The skin then closed with interrupted 4-0 nylon sutures. Sterile compressive dressing and volar splint applied.  On deflation of the tourniquet, all fingers immediately pinked.  He was taken to the recovery room for observation in satisfactory condition.  He will be discharged to home to return to the [place] in 1 week, on Norco.          ______________________________ Daryll Brod, M.D.     GK/MEDQ  D:  05/04/2017  T:  05/04/2017  Job:  [id number]

## 2017-05-04 NOTE — H&P (Signed)
John Benton is an 56 y.o. male.   Chief Complaint:ulnar wrist pain  HPI: John Benton is a 56 year old right-hand-dominant male former patient has not been seen since 2016. He comes in with a new problem. He was at work using a Copy when this twisted ulnarly deviating his hand he had a sharp pain on the ulnar aspect of his wrist. This occurred 3 weeks ago. He has been followed by [facility]. He has been given a splint to wear intermittently along with Aleve. He states this has given him minimal relief. He continues to complain of what was initially a sharp pain which is now becoming sharp dull depending on use with a VAS score is 7-8/10 especially with ulnar deviation rotation. He is occasionally awakened at night. He states that the Aleve has helped his knees. He complains of ulnar deviation increasing his pain. He has no prior history of injury. He has no history diabetes thyroid problems or gout. Family history is negative for each of these but is positive for arthritis. He has a history of Dupuytren's contracture which has been treated along with surgery on his shoulders in the past.we have obtained his MRI with revealing the tendinitis tenosynovitis of the extensor tendon this is over the triquetral area .         Past Medical History:  Diagnosis Date  . Arthritis   . Cancer ([place])    SKIN CANCERS REMOVED  . GERD (gastroesophageal reflux disease)   . Hyperlipemia   . Hypertension    NEW DIAGNOSIS 05/28/14 -PT PUT ON METOPROLOL AND IS TO FOLLOW UP WITH HIS MEDCIAL DOCTOR TODAY  06/26/14  . No pertinent past medical history   . Wears glasses     Past Surgical History:  Procedure Laterality Date  . biceps tenodesis- left shoulder  04/16/14   [facility]  . CERVICAL FUSION  2006  . COLONOSCOPY    . DUPUYTREN CONTRACTURE RELEASE  02/02/2012   Procedure: DUPUYTREN CONTRACTURE RELEASE;  Surgeon: Wynonia Sours, MD;  Location: [place];  Service: Orthopedics;   Laterality: Right;  fasciectomy right little, middle, and ring fingers of right hand.  Marland Kitchen FASCIECTOMY Left 05/16/2015   Procedure: FASCIECTOMY RECURRENCE LEFT SMALL FINGER WITH ;  Surgeon: Daryll Brod, MD;  Location: [place];  Service: Orthopedics;  Laterality: Left;  . FASCIOTOMY Left 05/17/2013   Procedure: FASCIECTOMY LEFT SMALL, RING, MIDDLE FINGERS;  Surgeon: Wynonia Sours, MD;  Location: [place];  Service: Orthopedics;  Laterality: Left;  . INGUINAL HERNIA REPAIR  2002   lt  . SHOULDER ARTHROSCOPY     right x2  . SHOULDER ARTHROSCOPY W/ ROTATOR CUFF REPAIR  2010   left  . SKIN FULL THICKNESS GRAFT Left 05/16/2015   Procedure: HYPOTHENAR SKIN GRAFT ;  Surgeon: Daryll Brod, MD;  Location: [place];  Service: Orthopedics;  Laterality: Left;  . SPERMATOCELECTOMY  2010  . TOTAL HIP ARTHROPLASTY Left 07/03/2014   Procedure: LEFT TOTAL HIP ARTHROPLASTY ANTERIOR APPROACH;  Surgeon: Mauri Pole, MD;  Location: [facility];  Service: Orthopedics;  Laterality: Left;  . UMBILICAL HERNIA REPAIR N/A 05/17/2013   Procedure: HERNIA REPAIR UMBILICAL ADULT;  Surgeon: Merrie Roof, MD;  Location: [place];  Service: General;  Laterality: N/A;    Family History  Problem Relation Age of Onset  . Kidney disease Mother   . Cancer Sister        Breast  .  Cancer Maternal Grandmother        Breast   Social History:  reports that he has quit smoking. His smoking use included Cigarettes. He has a 20.00 pack-year smoking history. He has never used smokeless tobacco. He reports that he drinks alcohol. He reports that he does not use drugs.  Allergies:  Allergies  Allergen Reactions  . Penicillins Anaphylaxis  . Keflex [Cephalexin] Swelling    Medications Prior to Admission  Medication Sig Dispense Refill  . CALCIUM PO Calcium    . Coenzyme Q10 (CO Q 10 PO) Take 500 mg by mouth at bedtime.    . fish oil-omega-3 fatty acids 1000 MG capsule  Take 1 g by mouth at bedtime.     Marland Kitchen lisinopril (PRINIVIL,ZESTRIL) 20 MG tablet Take 0.5 tablets (10 mg total) by mouth daily. 90 tablet 3  . Multiple Vitamins-Minerals (MULTIVITAMIN WITH MINERALS) tablet Take 1 tablet by mouth at bedtime.     Marland Kitchen omeprazole (PRILOSEC) 20 MG capsule Take 20 mg by mouth every morning.     . rosuvastatin (CRESTOR) 40 MG tablet TAKE ONE TABLET BY MOUTH DAILY 30 tablet 2    No results found for this or any previous visit (from the past 48 hour(s)).  No results found.   Pertinent items are noted in HPI.  Blood pressure (!) 144/95, pulse (!) 58, temperature 98.4 F (36.9 C), temperature source Oral, resp. rate 18, height 5\' 10"  (1.778 m), weight 98 kg (216 lb), SpO2 98 %.  General appearance: alert, cooperative and appears stated age Head: Normocephalic, without obvious abnormality Neck: no JVD Resp: clear to auscultation bilaterally Cardio: regular rate and rhythm, S1, S2 normal, no murmur, click, rub or gallop GI: soft, non-tender; bowel sounds normal; no masses,  no organomegaly Extremities: left wrist pain Pulses: 2+ and symmetric Skin: Skin color, texture, turgor normal. No rashes or lesions Neurologic: Grossly normal Incision/Wound: na  Assessment/Plan Assessment:  1. Tendinitis    Plan: His MRI is reviewed. He does show tendinitis with enlargement of the tendon of the ECU. I would recommend arthroscopy of his wrist with along with debridement of the tendon and that he would like to have something done surgically to this. He states that he is significantly limited by this. He would like to have this corrected as much as possible. Scheduled for arthroscopy wrist with open teno- synovectomy extensor tendon ECU left wrist as an outpatient under regional anesthesia. Pre-peri-and postoperative course are discussed along with risks and complications. He is aware that there is no guarantee to the surgery possibility of infection recurrence injury to arteries  nerves tendons complete relief symptoms and dystrophy. Shins are encouraged and answered to his satisfaction.      Stone Spirito R 05/04/2017, 8:34 AM

## 2017-05-04 NOTE — Op Note (Signed)
Dictation Number [id number]

## 2017-05-04 NOTE — Progress Notes (Signed)
Assisted Dr. Carignan with left, ultrasound guided, supraclavicular block. Side rails up, monitors on throughout procedure. See vital signs in flow sheet. Tolerated Procedure well. 

## 2017-05-04 NOTE — Anesthesia Procedure Notes (Addendum)
Procedure Name: LMA Insertion Date/Time: 05/04/2017 10:49 AM Performed by: Melynda Ripple D Pre-anesthesia Checklist: Patient identified, Emergency Drugs available, Suction available and Patient being monitored Patient Re-evaluated:Patient Re-evaluated prior to induction Oxygen Delivery Method: Circle system utilized Preoxygenation: Pre-oxygenation with 100% oxygen Induction Type: IV induction Ventilation: Mask ventilation without difficulty LMA: LMA inserted LMA Size: 5.0 Number of attempts: 1 Airway Equipment and Method: Bite block Placement Confirmation: positive ETCO2 Tube secured with: Tape Dental Injury: Teeth and Oropharynx as per pre-operative assessment

## 2017-05-04 NOTE — Anesthesia Postprocedure Evaluation (Addendum)
Anesthesia Post Note  Patient: JUERGEN HARDENBROOK  Procedure(s) Performed: LEFT WRIST TENOSYNOVECTOMY EXTENSOR CARPI ULNARIS (Left Wrist) WRIST ARTHROSCOPY WITH DEBRIDEMENT OF HAMMATE (Left Wrist)     Patient location during evaluation: PACU Anesthesia Type: Regional and MAC Level of consciousness: awake and alert Pain management: pain level controlled Vital Signs Assessment: post-procedure vital signs reviewed and stable Respiratory status: spontaneous breathing, nonlabored ventilation, respiratory function stable and patient connected to nasal cannula oxygen Cardiovascular status: blood pressure returned to baseline and stable Postop Assessment: no apparent nausea or vomiting Anesthetic complications: no    Last Vitals:  Vitals:   05/04/17 1130 05/04/17 1145  BP: 114/79 (!) 143/94  Pulse: 71 69  Resp: 14 18  Temp:  36.5 C  SpO2: 94% 97%    Last Pain:  Vitals:   05/04/17 1145  TempSrc: Oral  PainSc: 0-No pain                 Montez Hageman

## 2017-05-05 ENCOUNTER — Encounter (HOSPITAL_BASED_OUTPATIENT_CLINIC_OR_DEPARTMENT_OTHER): Payer: Self-pay | Admitting: Orthopedic Surgery

## 2017-05-05 NOTE — Addendum Note (Signed)
Addendum  created 05/05/17 1157 by Montez Hageman, MD   Sign clinical note, SmartForm saved

## 2017-08-24 DIAGNOSIS — M4726 Other spondylosis with radiculopathy, lumbar region: Secondary | ICD-10-CM | POA: Diagnosis not present

## 2017-08-24 DIAGNOSIS — M545 Low back pain: Secondary | ICD-10-CM | POA: Diagnosis not present

## 2017-08-26 ENCOUNTER — Other Ambulatory Visit: Payer: Self-pay | Admitting: Orthopaedic Surgery

## 2017-08-26 DIAGNOSIS — M545 Low back pain: Secondary | ICD-10-CM

## 2017-09-06 ENCOUNTER — Ambulatory Visit
Admission: RE | Admit: 2017-09-06 | Discharge: 2017-09-06 | Disposition: A | Discharge status: Home / Self Care | Payer: BLUE CROSS/BLUE SHIELD | Source: Ambulatory Visit | Attending: Orthopaedic Surgery | Admitting: Orthopaedic Surgery

## 2017-09-06 DIAGNOSIS — M48061 Spinal stenosis, lumbar region without neurogenic claudication: Secondary | ICD-10-CM | POA: Diagnosis not present

## 2017-09-06 DIAGNOSIS — M545 Low back pain: Secondary | ICD-10-CM

## 2017-09-23 DIAGNOSIS — M5416 Radiculopathy, lumbar region: Secondary | ICD-10-CM | POA: Diagnosis not present

## 2017-09-23 DIAGNOSIS — M4726 Other spondylosis with radiculopathy, lumbar region: Secondary | ICD-10-CM | POA: Diagnosis not present

## 2017-09-23 DIAGNOSIS — M545 Low back pain: Secondary | ICD-10-CM | POA: Diagnosis not present

## 2017-09-23 DIAGNOSIS — M5136 Other intervertebral disc degeneration, lumbar region: Secondary | ICD-10-CM | POA: Diagnosis not present

## 2017-10-12 DIAGNOSIS — M5136 Other intervertebral disc degeneration, lumbar region: Secondary | ICD-10-CM | POA: Diagnosis not present

## 2017-10-12 DIAGNOSIS — M5416 Radiculopathy, lumbar region: Secondary | ICD-10-CM | POA: Diagnosis not present

## 2017-10-12 DIAGNOSIS — M545 Low back pain: Secondary | ICD-10-CM | POA: Diagnosis not present

## 2017-10-28 DIAGNOSIS — M5136 Other intervertebral disc degeneration, lumbar region: Secondary | ICD-10-CM | POA: Diagnosis not present

## 2017-10-28 DIAGNOSIS — M545 Low back pain: Secondary | ICD-10-CM | POA: Diagnosis not present

## 2017-10-28 DIAGNOSIS — M4807 Spinal stenosis, lumbosacral region: Secondary | ICD-10-CM | POA: Diagnosis not present

## 2017-10-28 DIAGNOSIS — M4726 Other spondylosis with radiculopathy, lumbar region: Secondary | ICD-10-CM | POA: Diagnosis not present

## 2017-11-02 DIAGNOSIS — M5416 Radiculopathy, lumbar region: Secondary | ICD-10-CM | POA: Diagnosis not present

## 2017-12-08 DIAGNOSIS — M5416 Radiculopathy, lumbar region: Secondary | ICD-10-CM | POA: Diagnosis not present

## 2017-12-21 ENCOUNTER — Other Ambulatory Visit: Payer: Self-pay | Admitting: Sports Medicine

## 2017-12-21 DIAGNOSIS — E785 Hyperlipidemia, unspecified: Secondary | ICD-10-CM

## 2017-12-30 DIAGNOSIS — M4807 Spinal stenosis, lumbosacral region: Secondary | ICD-10-CM | POA: Diagnosis not present

## 2017-12-30 DIAGNOSIS — M5416 Radiculopathy, lumbar region: Secondary | ICD-10-CM | POA: Diagnosis not present

## 2017-12-30 DIAGNOSIS — M5136 Other intervertebral disc degeneration, lumbar region: Secondary | ICD-10-CM | POA: Diagnosis not present

## 2017-12-30 DIAGNOSIS — M545 Low back pain: Secondary | ICD-10-CM | POA: Diagnosis not present

## 2018-01-20 ENCOUNTER — Ambulatory Visit: Payer: BLUE CROSS/BLUE SHIELD | Admitting: Sports Medicine

## 2018-01-20 ENCOUNTER — Encounter: Payer: Self-pay | Admitting: Sports Medicine

## 2018-01-20 DIAGNOSIS — M48061 Spinal stenosis, lumbar region without neurogenic claudication: Secondary | ICD-10-CM | POA: Diagnosis not present

## 2018-01-20 NOTE — Progress Notes (Signed)
Subjective:    CC: Surgical clearance  HPI: This is a pleasant 57 year old male here for surgical clearance, he has severe multilevel lumbar spinal stenosis and has a front/back lumbar decompression with fusion plan.  He has greater than 4 metabolic equivalents of exercise capacity, no bleeding, and has had several similar surgeries in the past without any issues.  We do have an ECG from late last year.  He has had no changes in his overall health.  I reviewed the past medical history, family history, social history, surgical history, and allergies today and no changes were needed.  Please see the problem list section below in epic for further details.  Past Medical History: Past Medical History:  Diagnosis Date  . Arthritis   . Cancer ([place])    SKIN CANCERS REMOVED  . GERD (gastroesophageal reflux disease)   . Hyperlipemia   . Hypertension    NEW DIAGNOSIS 05/28/14 -PT PUT ON METOPROLOL AND IS TO FOLLOW UP WITH HIS MEDCIAL DOCTOR TODAY  06/26/14  . No pertinent past medical history   . Wears glasses    Past Surgical History: Past Surgical History:  Procedure Laterality Date  . biceps tenodesis- left shoulder  04/16/14   [facility]  . CERVICAL FUSION  2006  . COLONOSCOPY    . 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  Marland Kitchen FASCIECTOMY Left 05/16/2015   Procedure: FASCIECTOMY RECURRENCE LEFT SMALL FINGER WITH ;  Surgeon: Daryll Brod, MD;  Location: [place];  Service: Orthopedics;  Laterality: Left;  . FASCIOTOMY Left 05/17/2013   Procedure: FASCIECTOMY LEFT SMALL, RING, MIDDLE FINGERS;  Surgeon: Wynonia Sours, MD;  Location: [place];  Service: Orthopedics;  Laterality: Left;  . INGUINAL HERNIA REPAIR  2002   lt  .  SHOULDER ARTHROSCOPY     right x2  . SHOULDER ARTHROSCOPY W/ ROTATOR CUFF REPAIR  2010   left  . SKIN FULL THICKNESS GRAFT Left 05/16/2015   Procedure: HYPOTHENAR SKIN GRAFT ;  Surgeon: Daryll Brod, MD;  Location: [place];  Service: Orthopedics;  Laterality: Left;  . SPERMATOCELECTOMY  2010  . TENOSYNOVECTOMY Left 05/04/2017   Procedure: LEFT WRIST TENOSYNOVECTOMY EXTENSOR CARPI ULNARIS;  Surgeon: Daryll Brod, MD;  Location: [place];  Service: Orthopedics;  Laterality: Left;  . TOTAL HIP ARTHROPLASTY Left 07/03/2014   Procedure: LEFT TOTAL HIP ARTHROPLASTY ANTERIOR APPROACH;  Surgeon: Mauri Pole, MD;  [facility];  Service: Orthopedics;  Laterality: Left;  . UMBILICAL HERNIA REPAIR N/A 05/17/2013   Procedure: HERNIA REPAIR UMBILICAL ADULT;  Surgeon: Merrie Roof, MD;  Location: [place];  Service: General;  Laterality: N/A;  . WRIST ARTHROSCOPY WITH DEBRIDEMENT Left 05/04/2017   Procedure: WRIST ARTHROSCOPY WITH DEBRIDEMENT OF HAMMATE;  Surgeon: Daryll Brod, MD;  Location: [place];  Service: Orthopedics;  Laterality: Left;   Social History: Social History   Socioeconomic History  . Marital status: Married    Spouse name: Not on file  . Number of children: Not on file  . Years of education: Not on file  . Highest education level: Not on file  Occupational History  . Not on file  Social Needs  . Financial resource strain: Not on file  . Food insecurity:  Worry: Not on file    Inability: Not on file  . Transportation needs:    Medical: Not on file    Non-medical: Not on file  Tobacco Use  . Smoking status: Former Smoker    Packs/day: 1.00    Years: 20.00    Pack years: 20.00    Types: Cigarettes  . Smokeless tobacco: Never Used  Substance and Sexual Activity  . Alcohol use: Yes    Comment: social  . Drug use: No  . Sexual activity: Not on file  Lifestyle  . Physical activity:    Days per  week: Not on file    Minutes per session: Not on file  . Stress: Not on file  Relationships  . Social connections:    Talks on phone: Not on file    Gets together: Not on file    Attends religious service: Not on file    Active member of club or organization: Not on file    Attends meetings of clubs or organizations: Not on file    Relationship status: Not on file  Other Topics Concern  . Not on file  Social History Narrative  . Not on file   Family History: Family History  Problem Relation Age of Onset  . Kidney disease Mother   . Cancer Sister        Breast  . Cancer Maternal Grandmother        Breast   Allergies: Allergies  Allergen Reactions  . Penicillins Anaphylaxis  . Keflex [Cephalexin] Swelling   Medications: See med rec.  Review of Systems: No fevers, chills, night sweats, weight loss, chest pain, or shortness of breath.   Objective:    General: Well Developed, well nourished, and in no acute distress.  Neuro: Alert and oriented x3, extra-ocular muscles intact, sensation grossly intact.  HEENT: Normocephalic, atraumatic, pupils equal round reactive to light, neck supple, no masses, no lymphadenopathy, thyroid nonpalpable.  Skin: Warm and dry, no rashes. Cardiac: Regular rate and rhythm, no murmurs rubs or gallops, no lower extremity edema.  Respiratory: Clear to auscultation bilaterally. Not using accessory muscles, speaking in full sentences.  Impression and Recommendations:    Lumbar spinal stenosis Front/back laminectomy with fusion planned. Patient is cleared for intermediate risk noncardiac surgery, he has greater than 4 metabolic equivalents of exercise capacity. Checking labs. We have a recent ECG that was unremarkable. Letter written, he will probably drop off a form as well. ___________________________________________ Gwen Her. Dianah Field, M.D., ABFM., CAQSM. Primary Care and [place]  Instructor of [place] [employer] [facility] of Medicine

## 2018-01-20 NOTE — Assessment & Plan Note (Signed)
Front/back laminectomy with fusion planned. Patient is cleared for intermediate risk noncardiac surgery, he has greater than 4 metabolic equivalents of exercise capacity. Checking labs. We have a recent ECG that was unremarkable. Letter written, he will probably drop off a form as well.

## 2018-01-21 LAB — COMPREHENSIVE METABOLIC PANEL
Albumin: 5 g/dL (ref 3.6–5.1)
BUN: 20 mg/dL (ref 7–25)
CO2: 28 mmol/L (ref 20–32)
Calcium: 10 mg/dL (ref 8.6–10.3)
Chloride: 102 mmol/L (ref 98–110)
Creat: 1.06 mg/dL (ref 0.70–1.33)
Globulin: 2.2 g/dL (calc) (ref 1.9–3.7)
Potassium: 4.6 mmol/L (ref 3.5–5.3)
Sodium: 139 mmol/L (ref 135–146)
Total Bilirubin: 0.6 mg/dL (ref 0.2–1.2)
Total Protein: 7.2 g/dL (ref 6.1–8.1)

## 2018-01-21 LAB — CBC
HCT: 48.9 % (ref 38.5–50.0)
Hemoglobin: 16.3 g/dL (ref 13.2–17.1)
MCH: 31.2 pg (ref 27.0–33.0)
MCHC: 33.3 g/dL (ref 32.0–36.0)
MCV: 93.7 fL (ref 80.0–100.0)
MPV: 9.7 fL (ref 7.5–12.5)
Platelets: 233 Thousand/uL (ref 140–400)
RBC: 5.22 10*6/uL (ref 4.20–5.80)
RDW: 12.4 % (ref 11.0–15.0)
WBC: 6.5 10*3/uL (ref 3.8–10.8)

## 2018-01-21 LAB — COMPREHENSIVE METABOLIC PANEL WITH GFR
AG Ratio: 2.3 (calc) (ref 1.0–2.5)
ALT: 28 U/L (ref 9–46)
AST: 26 U/L (ref 10–35)
Alkaline phosphatase (APISO): 78 U/L (ref 40–115)
Glucose, Bld: 103 mg/dL — ABNORMAL HIGH (ref 65–99)

## 2018-01-21 LAB — LIPID PANEL W/REFLEX DIRECT LDL
Cholesterol: 215 mg/dL — ABNORMAL HIGH (ref <200)
HDL: 43 mg/dL (ref >40)
Non-HDL Cholesterol (Calc): 172 mg/dL — ABNORMAL HIGH (ref <130)
Total CHOL/HDL Ratio: 5 (calc) — ABNORMAL HIGH (ref <5.0)
Triglycerides: 428 mg/dL — ABNORMAL HIGH (ref <150)

## 2018-01-21 LAB — DIRECT LDL: Direct LDL: 64 mg/dL (ref <100)

## 2018-01-21 LAB — TSH: TSH: 1.78 m[IU]/L (ref 0.40–4.50)

## 2018-01-25 ENCOUNTER — Other Ambulatory Visit: Payer: Self-pay | Admitting: Sports Medicine

## 2018-01-25 DIAGNOSIS — E785 Hyperlipidemia, unspecified: Secondary | ICD-10-CM

## 2018-01-27 DIAGNOSIS — M4716 Other spondylosis with myelopathy, lumbar region: Secondary | ICD-10-CM | POA: Diagnosis not present

## 2018-01-27 DIAGNOSIS — M545 Low back pain: Secondary | ICD-10-CM | POA: Diagnosis not present

## 2018-01-27 DIAGNOSIS — M5416 Radiculopathy, lumbar region: Secondary | ICD-10-CM | POA: Diagnosis not present

## 2018-01-27 DIAGNOSIS — M5136 Other intervertebral disc degeneration, lumbar region: Secondary | ICD-10-CM | POA: Diagnosis not present

## 2018-01-27 DIAGNOSIS — Z4689 Encounter for fitting and adjustment of other specified devices: Secondary | ICD-10-CM | POA: Diagnosis not present

## 2018-02-01 ENCOUNTER — Ambulatory Visit: Payer: BLUE CROSS/BLUE SHIELD | Admitting: Sports Medicine

## 2018-02-28 DIAGNOSIS — M4710 Other spondylosis with myelopathy, site unspecified: Secondary | ICD-10-CM | POA: Diagnosis not present

## 2018-02-28 DIAGNOSIS — Z01818 Encounter for other preprocedural examination: Secondary | ICD-10-CM | POA: Diagnosis not present

## 2018-02-28 DIAGNOSIS — M4800 Spinal stenosis, site unspecified: Secondary | ICD-10-CM | POA: Diagnosis not present

## 2018-02-28 DIAGNOSIS — M720 Palmar fascial fibromatosis [Dupuytren]: Secondary | ICD-10-CM | POA: Diagnosis not present

## 2018-03-02 ENCOUNTER — Encounter: Payer: BLUE CROSS/BLUE SHIELD | Admitting: Sports Medicine

## 2018-03-07 DIAGNOSIS — M48062 Spinal stenosis, lumbar region with neurogenic claudication: Secondary | ICD-10-CM | POA: Diagnosis not present

## 2018-03-07 DIAGNOSIS — M48061 Spinal stenosis, lumbar region without neurogenic claudication: Secondary | ICD-10-CM | POA: Diagnosis not present

## 2018-03-07 DIAGNOSIS — Z79899 Other long term (current) drug therapy: Secondary | ICD-10-CM | POA: Diagnosis not present

## 2018-03-07 DIAGNOSIS — Z880 Allergy status to penicillin: Secondary | ICD-10-CM | POA: Diagnosis not present

## 2018-03-07 DIAGNOSIS — Z87891 Personal history of nicotine dependence: Secondary | ICD-10-CM | POA: Diagnosis not present

## 2018-03-07 DIAGNOSIS — I10 Essential (primary) hypertension: Secondary | ICD-10-CM | POA: Diagnosis not present

## 2018-03-07 DIAGNOSIS — M532X6 Spinal instabilities, lumbar region: Secondary | ICD-10-CM | POA: Diagnosis not present

## 2018-03-07 DIAGNOSIS — M5106 Intervertebral disc disorders with myelopathy, lumbar region: Secondary | ICD-10-CM | POA: Diagnosis not present

## 2018-03-07 DIAGNOSIS — K219 Gastro-esophageal reflux disease without esophagitis: Secondary | ICD-10-CM | POA: Diagnosis not present

## 2018-03-07 DIAGNOSIS — E785 Hyperlipidemia, unspecified: Secondary | ICD-10-CM | POA: Diagnosis not present

## 2018-03-07 DIAGNOSIS — M4716 Other spondylosis with myelopathy, lumbar region: Secondary | ICD-10-CM | POA: Diagnosis not present

## 2018-03-07 DIAGNOSIS — M47816 Spondylosis without myelopathy or radiculopathy, lumbar region: Secondary | ICD-10-CM | POA: Diagnosis not present

## 2018-03-07 DIAGNOSIS — M47896 Other spondylosis, lumbar region: Secondary | ICD-10-CM | POA: Diagnosis not present

## 2018-03-07 DIAGNOSIS — M4807 Spinal stenosis, lumbosacral region: Secondary | ICD-10-CM | POA: Diagnosis not present

## 2018-03-07 DIAGNOSIS — M4326 Fusion of spine, lumbar region: Secondary | ICD-10-CM | POA: Diagnosis not present

## 2018-03-08 DIAGNOSIS — M4716 Other spondylosis with myelopathy, lumbar region: Secondary | ICD-10-CM | POA: Diagnosis not present

## 2018-03-08 DIAGNOSIS — M47816 Spondylosis without myelopathy or radiculopathy, lumbar region: Secondary | ICD-10-CM | POA: Diagnosis not present

## 2018-03-08 DIAGNOSIS — M5106 Intervertebral disc disorders with myelopathy, lumbar region: Secondary | ICD-10-CM | POA: Diagnosis not present

## 2018-04-04 ENCOUNTER — Encounter: Payer: BLUE CROSS/BLUE SHIELD | Admitting: Sports Medicine

## 2018-04-05 DIAGNOSIS — M48062 Spinal stenosis, lumbar region with neurogenic claudication: Secondary | ICD-10-CM | POA: Diagnosis not present

## 2018-04-20 ENCOUNTER — Ambulatory Visit (INDEPENDENT_AMBULATORY_CARE_PROVIDER_SITE_OTHER): Payer: BLUE CROSS/BLUE SHIELD | Admitting: Sports Medicine

## 2018-04-20 ENCOUNTER — Encounter: Payer: Self-pay | Admitting: Sports Medicine

## 2018-04-20 VITALS — BP 124/77 | HR 74 | Ht 70.0 in | Wt 223.0 lb

## 2018-04-20 DIAGNOSIS — Z23 Encounter for immunization: Secondary | ICD-10-CM

## 2018-04-20 DIAGNOSIS — E782 Mixed hyperlipidemia: Secondary | ICD-10-CM

## 2018-04-20 DIAGNOSIS — Z0000 Encounter for general adult medical examination without abnormal findings: Secondary | ICD-10-CM | POA: Diagnosis not present

## 2018-04-20 DIAGNOSIS — I10 Essential (primary) hypertension: Secondary | ICD-10-CM | POA: Diagnosis not present

## 2018-04-20 DIAGNOSIS — N529 Male erectile dysfunction, unspecified: Secondary | ICD-10-CM | POA: Insufficient documentation

## 2018-04-20 DIAGNOSIS — Z125 Encounter for screening for malignant neoplasm of prostate: Secondary | ICD-10-CM | POA: Diagnosis not present

## 2018-04-20 DIAGNOSIS — M48061 Spinal stenosis, lumbar region without neurogenic claudication: Secondary | ICD-10-CM

## 2018-04-20 MED ORDER — SILDENAFIL CITRATE 20 MG PO TABS: 20.0000 mg | ORAL_TABLET | ORAL | 11 refills | Status: DC | PRN

## 2018-04-20 NOTE — Assessment & Plan Note (Signed)
Rechecking lipids. 

## 2018-04-20 NOTE — Assessment & Plan Note (Signed)
Doing extremely well after front/back laminectomy with fusion. He is 6 weeks postop and feeling extremely well.

## 2018-04-20 NOTE — Assessment & Plan Note (Signed)
Annual physical as above. Rechecking some routine labs including PSA.

## 2018-04-20 NOTE — Assessment & Plan Note (Signed)
Well controlled 

## 2018-04-20 NOTE — Progress Notes (Signed)
Subjective:    CC: Annual physical  HPI:  John Benton is here for his physical, he has no complaints.  He is post multilevel front/back laminectomy with fusion, doing extremely well 6 weeks postop.  Blood pressure is well controlled, he is happy with how things are going, he would like to get some routine labs done.  He has sufficient sex drive but does endorse some difficulty with maintaining and achieving erections of sufficient quality.  I reviewed the past medical history, family history, social history, surgical history, and allergies today and no changes were needed.  Please see the problem list section below in epic for further details.  Past Medical History: Past Medical History:  Diagnosis Date  . Arthritis   . Cancer ([place])    SKIN CANCERS REMOVED  . GERD (gastroesophageal reflux disease)   . Hyperlipemia   . Hypertension    NEW DIAGNOSIS 05/28/14 -PT PUT ON METOPROLOL AND IS TO FOLLOW UP WITH HIS MEDCIAL DOCTOR TODAY  06/26/14  . No pertinent past medical history   . Wears glasses    Past Surgical History: Past Surgical History:  Procedure Laterality Date  . biceps tenodesis- left shoulder  04/16/14   [facility]  . CERVICAL FUSION  2006  . COLONOSCOPY    . 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  Marland Kitchen FASCIECTOMY Left 05/16/2015   Procedure: FASCIECTOMY RECURRENCE LEFT SMALL FINGER WITH ;  Surgeon: Daryll Brod, MD;  Location: [place];  Service: Orthopedics;  Laterality: Left;  . FASCIOTOMY Left 05/17/2013   Procedure: FASCIECTOMY LEFT SMALL, RING, MIDDLE FINGERS;  Surgeon: Wynonia Sours, MD;  Location: [place];  Service: Orthopedics;  Laterality: Left;  . INGUINAL HERNIA REPAIR  2002   lt  . SHOULDER ARTHROSCOPY      right x2  . SHOULDER ARTHROSCOPY W/ ROTATOR CUFF REPAIR  2010   left  . SKIN FULL THICKNESS GRAFT Left 05/16/2015   Procedure: HYPOTHENAR SKIN GRAFT ;  Surgeon: Daryll Brod, MD;  Location: [place];  Service: Orthopedics;  Laterality: Left;  . SPERMATOCELECTOMY  2010  . TENOSYNOVECTOMY Left 05/04/2017   Procedure: LEFT WRIST TENOSYNOVECTOMY EXTENSOR CARPI ULNARIS;  Surgeon: Daryll Brod, MD;  Location: [place];  Service: Orthopedics;  Laterality: Left;  . TOTAL HIP ARTHROPLASTY Left 07/03/2014   Procedure: LEFT TOTAL HIP ARTHROPLASTY ANTERIOR APPROACH;  Surgeon: Mauri Pole, MD;  [facility];  Service: Orthopedics;  Laterality: Left;  . UMBILICAL HERNIA REPAIR N/A 05/17/2013   Procedure: HERNIA REPAIR UMBILICAL ADULT;  Surgeon: Merrie Roof, MD;  Location: [place];  Service: General;  Laterality: N/A;  . WRIST ARTHROSCOPY WITH DEBRIDEMENT Left 05/04/2017   Procedure: WRIST ARTHROSCOPY WITH DEBRIDEMENT OF HAMMATE;  Surgeon: Daryll Brod, MD;  Location: [place];  Service: Orthopedics;  Laterality: Left;   Social History: Social History   Socioeconomic History  . Marital status: Married    Spouse name: Not on file  . Number of children: Not on file  . Years of education: Not on file  . Highest education level: Not on file  Occupational History  . Not on file  Social Needs  . Financial resource strain: Not on file  . Food insecurity:  Worry: Not on file    Inability: Not on file  . Transportation needs:    Medical: Not on file    Non-medical: Not on file  Tobacco Use  . Smoking status: Former Smoker    Packs/day: 1.00    Years: 20.00    Pack years: 20.00    Types: Cigarettes  . Smokeless tobacco: Never Used  Substance and Sexual Activity  . Alcohol use: Yes    Comment: social  . Drug use: No  . Sexual activity: Not on file  Lifestyle  . Physical activity:    Days per week: Not on file     Minutes per session: Not on file  . Stress: Not on file  Relationships  . Social connections:    Talks on phone: Not on file    Gets together: Not on file    Attends religious service: Not on file    Active member of club or organization: Not on file    Attends meetings of clubs or organizations: Not on file    Relationship status: Not on file  Other Topics Concern  . Not on file  Social History Narrative  . Not on file   Family History: Family History  Problem Relation Age of Onset  . Kidney disease Mother   . Cancer Sister        Breast  . Cancer Maternal Grandmother        Breast   Allergies: Allergies  Allergen Reactions  . Penicillins Anaphylaxis  . Keflex [Cephalexin] Swelling   Medications: See med rec.  Review of Systems: No headache, visual changes, nausea, vomiting, diarrhea, constipation, dizziness, abdominal pain, skin rash, fevers, chills, night sweats, swollen lymph nodes, weight loss, chest pain, body aches, joint swelling, muscle aches, shortness of breath, mood changes, visual or auditory hallucinations.  Objective:    General: Well Developed, well nourished, and in no acute distress.  Neuro: Alert and oriented x3, extra-ocular muscles intact, sensation grossly intact. Cranial nerves II through XII are intact, motor, sensory, and coordinative functions are all intact. HEENT: Normocephalic, atraumatic, pupils equal round reactive to light, neck supple, no masses, no lymphadenopathy, thyroid nonpalpable. Oropharynx, nasopharynx, external ear canals are unremarkable. Skin: Warm and dry, no rashes noted.  Cardiac: Regular rate and rhythm, no murmurs rubs or gallops.  Respiratory: Clear to auscultation bilaterally. Not using accessory muscles, speaking in full sentences.  Abdominal: Soft, nontender, nondistended, positive bowel sounds, no masses, no organomegaly.  Musculoskeletal: Shoulder, elbow, wrist, hip, knee, ankle stable, and with full range of  motion.  Impression and Recommendations:    The patient was counselled, risk factors were discussed, anticipatory guidance given.  Annual physical exam Annual physical as above. Rechecking some routine labs including PSA.  Hyperlipidemia Rechecking lipids.  Essential hypertension, benign Well-controlled.  Erectile dysfunction Starting generic Viagra  Lumbar spinal stenosis Doing extremely well after front/back laminectomy with fusion. He is 6 weeks postop and feeling extremely well.  ___________________________________________ Gwen Her. Dianah Field, M.D., ABFM., CAQSM. Primary Care and [place] Instructor of [place] of [facility] of Medicine

## 2018-04-20 NOTE — Assessment & Plan Note (Signed)
Starting generic Viagra

## 2018-04-21 LAB — PSA, TOTAL AND FREE
PSA, % Free: 14 % — ABNORMAL LOW (ref >25)
PSA, Free: 0.1 ng/mL
PSA, Total: 0.7 ng/mL (ref <=4.0)

## 2018-04-21 LAB — COMPREHENSIVE METABOLIC PANEL WITH GFR
AST: 24 U/L (ref 10–35)
Alkaline phosphatase (APISO): 100 U/L (ref 40–115)
Calcium: 9.8 mg/dL (ref 8.6–10.3)
Chloride: 102 mmol/L (ref 98–110)
Creat: 0.93 mg/dL (ref 0.70–1.33)
Total Bilirubin: 0.5 mg/dL (ref 0.2–1.2)

## 2018-04-21 LAB — COMPREHENSIVE METABOLIC PANEL
AG Ratio: 2.2 (calc) (ref 1.0–2.5)
ALT: 30 U/L (ref 9–46)
Albumin: 4.6 g/dL (ref 3.6–5.1)
BUN: 21 mg/dL (ref 7–25)
CO2: 29 mmol/L (ref 20–32)
Globulin: 2.1 g/dL (calc) (ref 1.9–3.7)
Glucose, Bld: 96 mg/dL (ref 65–99)
Potassium: 4.2 mmol/L (ref 3.5–5.3)
Sodium: 136 mmol/L (ref 135–146)
Total Protein: 6.7 g/dL (ref 6.1–8.1)

## 2018-04-21 LAB — CBC
HCT: 44.3 % (ref 38.5–50.0)
Hemoglobin: 14.8 g/dL (ref 13.2–17.1)
MCH: 30.8 pg (ref 27.0–33.0)
MCHC: 33.4 g/dL (ref 32.0–36.0)
MCV: 92.3 fL (ref 80.0–100.0)
MPV: 9.3 fL (ref 7.5–12.5)
Platelets: 253 Thousand/uL (ref 140–400)
RBC: 4.8 10*6/uL (ref 4.20–5.80)
RDW: 12.8 % (ref 11.0–15.0)
WBC: 5.3 10*3/uL (ref 3.8–10.8)

## 2018-04-21 LAB — LIPID PANEL W/REFLEX DIRECT LDL
Cholesterol: 139 mg/dL (ref <200)
HDL: 49 mg/dL (ref >40)
LDL Cholesterol (Calc): 67 mg/dL (calc)
Non-HDL Cholesterol (Calc): 90 mg/dL (calc) (ref <130)
Total CHOL/HDL Ratio: 2.8 (calc) (ref <5.0)
Triglycerides: 148 mg/dL (ref <150)

## 2018-05-27 ENCOUNTER — Other Ambulatory Visit: Payer: Self-pay | Admitting: *Deleted

## 2018-05-27 DIAGNOSIS — E785 Hyperlipidemia, unspecified: Secondary | ICD-10-CM

## 2018-05-27 MED ORDER — ROSUVASTATIN CALCIUM 40 MG PO TABS: 40.0000 mg | ORAL_TABLET | Freq: Every day | ORAL | 1 refills | Status: DC

## 2018-06-08 DIAGNOSIS — M4326 Fusion of spine, lumbar region: Secondary | ICD-10-CM | POA: Diagnosis not present

## 2018-06-14 ENCOUNTER — Encounter: Payer: Self-pay | Admitting: Sports Medicine

## 2018-06-14 DIAGNOSIS — I10 Essential (primary) hypertension: Secondary | ICD-10-CM

## 2018-06-14 MED ORDER — LISINOPRIL 20 MG PO TABS: 10.0000 mg | ORAL_TABLET | Freq: Every day | ORAL | 3 refills | Status: DC

## 2018-08-14 DIAGNOSIS — Z23 Encounter for immunization: Secondary | ICD-10-CM | POA: Diagnosis not present

## 2018-08-17 DIAGNOSIS — H524 Presbyopia: Secondary | ICD-10-CM | POA: Diagnosis not present

## 2018-08-17 DIAGNOSIS — H5212 Myopia, left eye: Secondary | ICD-10-CM | POA: Diagnosis not present

## 2018-08-17 DIAGNOSIS — Z961 Presence of intraocular lens: Secondary | ICD-10-CM | POA: Diagnosis not present

## 2018-09-26 DIAGNOSIS — H25012 Cortical age-related cataract, left eye: Secondary | ICD-10-CM | POA: Diagnosis not present

## 2018-09-26 DIAGNOSIS — H25042 Posterior subcapsular polar age-related cataract, left eye: Secondary | ICD-10-CM | POA: Diagnosis not present

## 2018-09-26 DIAGNOSIS — H53001 Unspecified amblyopia, right eye: Secondary | ICD-10-CM | POA: Diagnosis not present

## 2018-09-26 DIAGNOSIS — H2512 Age-related nuclear cataract, left eye: Secondary | ICD-10-CM | POA: Diagnosis not present

## 2018-09-26 DIAGNOSIS — H31011 Macula scars of posterior pole (postinflammatory) (post-traumatic), right eye: Secondary | ICD-10-CM | POA: Diagnosis not present

## 2018-10-04 ENCOUNTER — Encounter: Payer: Self-pay | Admitting: Sports Medicine

## 2018-10-04 DIAGNOSIS — E785 Hyperlipidemia, unspecified: Secondary | ICD-10-CM

## 2018-10-04 MED ORDER — ROSUVASTATIN CALCIUM 40 MG PO TABS: 40.0000 mg | ORAL_TABLET | Freq: Every day | ORAL | 1 refills | Status: DC

## 2018-10-12 ENCOUNTER — Encounter: Payer: Self-pay | Admitting: Sports Medicine

## 2018-10-12 DIAGNOSIS — I10 Essential (primary) hypertension: Secondary | ICD-10-CM

## 2018-10-12 MED ORDER — LISINOPRIL 20 MG PO TABS: 10.0000 mg | ORAL_TABLET | Freq: Every day | ORAL | 1 refills | Status: DC

## 2018-11-28 DIAGNOSIS — H2512 Age-related nuclear cataract, left eye: Secondary | ICD-10-CM | POA: Diagnosis not present

## 2019-03-03 ENCOUNTER — Other Ambulatory Visit: Payer: Self-pay | Admitting: Sports Medicine

## 2019-03-03 DIAGNOSIS — E785 Hyperlipidemia, unspecified: Secondary | ICD-10-CM

## 2019-03-29 ENCOUNTER — Other Ambulatory Visit: Payer: Self-pay | Admitting: Sports Medicine

## 2019-03-29 DIAGNOSIS — E785 Hyperlipidemia, unspecified: Secondary | ICD-10-CM

## 2019-03-31 ENCOUNTER — Other Ambulatory Visit: Payer: Self-pay | Admitting: Orthopaedic Surgery

## 2019-03-31 DIAGNOSIS — M4716 Other spondylosis with myelopathy, lumbar region: Secondary | ICD-10-CM

## 2019-04-01 ENCOUNTER — Other Ambulatory Visit: Payer: Self-pay

## 2019-04-01 ENCOUNTER — Ambulatory Visit
Admission: RE | Admit: 2019-04-01 | Discharge: 2019-04-01 | Disposition: A | Discharge status: Home / Self Care | Payer: 59 | Source: Ambulatory Visit | Attending: Orthopaedic Surgery | Admitting: Orthopaedic Surgery

## 2019-04-01 DIAGNOSIS — M4716 Other spondylosis with myelopathy, lumbar region: Secondary | ICD-10-CM

## 2019-04-07 ENCOUNTER — Other Ambulatory Visit: Payer: Self-pay

## 2019-04-07 ENCOUNTER — Encounter: Payer: Self-pay | Admitting: Sports Medicine

## 2019-04-07 ENCOUNTER — Ambulatory Visit (INDEPENDENT_AMBULATORY_CARE_PROVIDER_SITE_OTHER): Payer: 59 | Admitting: Sports Medicine

## 2019-04-07 VITALS — BP 164/96 | HR 72 | Ht 70.0 in | Wt 226.0 lb

## 2019-04-07 DIAGNOSIS — Z23 Encounter for immunization: Secondary | ICD-10-CM | POA: Diagnosis not present

## 2019-04-07 DIAGNOSIS — I10 Essential (primary) hypertension: Secondary | ICD-10-CM | POA: Diagnosis not present

## 2019-04-07 DIAGNOSIS — M48061 Spinal stenosis, lumbar region without neurogenic claudication: Secondary | ICD-10-CM

## 2019-04-07 DIAGNOSIS — N529 Male erectile dysfunction, unspecified: Secondary | ICD-10-CM | POA: Diagnosis not present

## 2019-04-07 DIAGNOSIS — Z0000 Encounter for general adult medical examination without abnormal findings: Secondary | ICD-10-CM

## 2019-04-07 NOTE — Assessment & Plan Note (Signed)
Post L3-L5 posterior fusion. Having recurrence of pain, radicular down the left leg, L5-S1 microdiscectomy planned.

## 2019-04-07 NOTE — Progress Notes (Signed)
Subjective:    CC: CPE  HPI:  John Benton is here for his physical, he really does not have any complaints other than his back pain.  I reviewed the past medical history, family history, social history, surgical history, and allergies today and no changes were needed.  Please see the problem list section below in epic for further details.  Past Medical History: Past Medical History:  Diagnosis Date  . Arthritis   . Cancer ([place])    SKIN CANCERS REMOVED  . GERD (gastroesophageal reflux disease)   . Hyperlipemia   . Hypertension    NEW DIAGNOSIS 05/28/14 -PT PUT ON METOPROLOL AND IS TO FOLLOW UP WITH HIS MEDCIAL DOCTOR TODAY  06/26/14  . No pertinent past medical history   . Wears glasses    Past Surgical History: Past Surgical History:  Procedure Laterality Date  . biceps tenodesis- left shoulder  04/16/14   [facility]  . CERVICAL FUSION  2006  . COLONOSCOPY    . 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  Marland Kitchen FASCIECTOMY Left 05/16/2015   Procedure: FASCIECTOMY RECURRENCE LEFT SMALL FINGER WITH ;  Surgeon: Daryll Brod, MD;  Location: [place];  Service: Orthopedics;  Laterality: Left;  . FASCIOTOMY Left 05/17/2013   Procedure: FASCIECTOMY LEFT SMALL, RING, MIDDLE FINGERS;  Surgeon: Wynonia Sours, MD;  Location: [place];  Service: Orthopedics;  Laterality: Left;  . INGUINAL HERNIA REPAIR  2002   lt  . SHOULDER ARTHROSCOPY     right x2  . SHOULDER ARTHROSCOPY W/ ROTATOR CUFF REPAIR  2010   left  . SKIN FULL THICKNESS GRAFT Left 05/16/2015   Procedure: HYPOTHENAR SKIN GRAFT ;  Surgeon: Daryll Brod, MD;  Location: [place];  Service: Orthopedics;  Laterality: Left;  . SPERMATOCELECTOMY  2010  . TENOSYNOVECTOMY  Left 05/04/2017   Procedure: LEFT WRIST TENOSYNOVECTOMY EXTENSOR CARPI ULNARIS;  Surgeon: Daryll Brod, MD;  Location: [place];  Service: Orthopedics;  Laterality: Left;  . TOTAL HIP ARTHROPLASTY Left 07/03/2014   Procedure: LEFT TOTAL HIP ARTHROPLASTY ANTERIOR APPROACH;  Surgeon: Mauri Pole, MD;  Location: [facility];  Service: Orthopedics;  Laterality: Left;  . UMBILICAL HERNIA REPAIR N/A 05/17/2013   Procedure: HERNIA REPAIR UMBILICAL ADULT;  Surgeon: Merrie Roof, MD;  Location: [place];  Service: General;  Laterality: N/A;  . WRIST ARTHROSCOPY WITH DEBRIDEMENT Left 05/04/2017   Procedure: WRIST ARTHROSCOPY WITH DEBRIDEMENT OF HAMMATE;  Surgeon: Daryll Brod, MD;  Location: [place];  Service: Orthopedics;  Laterality: Left;   Social History: Social History   Socioeconomic History  . Marital status: Married    Spouse name: Not on file  . Number of children: Not on file  . Years of education: Not on file  . Highest education level: Not on file  Occupational History  . Not on file  Social Needs  . Financial resource strain: Not on file  . Food insecurity    Worry: Not on file    Inability: Not on file  . Transportation needs    Medical: Not on file    Non-medical: Not on file  Tobacco Use  . Smoking status: Former Smoker    Packs/day: 1.00    Years: 20.00  Pack years: 20.00    Types: Cigarettes  . Smokeless tobacco: Never Used  Substance and Sexual Activity  . Alcohol use: Yes    Comment: social  . Drug use: No  . Sexual activity: Not on file  Lifestyle  . Physical activity    Days per week: Not on file    Minutes per session: Not on file  . Stress: Not on file  Relationships  . Social Herbalist on phone: Not on file    Gets together: Not on file    Attends religious service: Not on file    Active member of club or organization: Not on file    Attends meetings of clubs or organizations: Not on file     Relationship status: Not on file  Other Topics Concern  . Not on file  Social History Narrative  . Not on file   Family History: Family History  Problem Relation Age of Onset  . Kidney disease Mother   . Cancer Sister        Breast  . Cancer Maternal Grandmother        Breast   Allergies: Allergies  Allergen Reactions  . Penicillins Anaphylaxis  . Keflex [Cephalexin] Swelling   Medications: See med rec.  Review of Systems: No headache, visual changes, nausea, vomiting, diarrhea, constipation, dizziness, abdominal pain, skin rash, fevers, chills, night sweats, swollen lymph nodes, weight loss, chest pain, body aches, joint swelling, muscle aches, shortness of breath, mood changes, visual or auditory hallucinations.  Objective:    General: Well Developed, well nourished, and in no acute distress.  Neuro: Alert and oriented x3, extra-ocular muscles intact, sensation grossly intact. Cranial nerves II through XII are intact, motor, sensory, and coordinative functions are all intact. HEENT: Normocephalic, atraumatic, pupils equal round reactive to light, neck supple, no masses, no lymphadenopathy, thyroid nonpalpable. Oropharynx, nasopharynx, external ear canals are unremarkable. Skin: Warm and dry, no rashes noted.  Cardiac: Regular rate and rhythm, no murmurs rubs or gallops.  Respiratory: Clear to auscultation bilaterally. Not using accessory muscles, speaking in full sentences.  Abdominal: Soft, nontender, nondistended, positive bowel sounds, no masses, no organomegaly.  Musculoskeletal: Shoulder, elbow, wrist, hip, knee, ankle stable, and with full range of motion.  Impression and Recommendations:    The patient was counselled, risk factors were discussed, anticipatory guidance given.  Annual physical exam Routine annual physical as above. Checking some labs, he is up-to-date on all of his vaccines.  Essential hypertension, benign Elevated, increasing lisinopril to a  full tab daily. Return in 2 weeks for a nurse visit blood pressure check  Lumbar spinal stenosis Post L3-L5 posterior fusion. Having recurrence of pain, radicular down the left leg, L5-S1 microdiscectomy planned.   ___________________________________________ Gwen Her. Dianah Field, M.D., ABFM., CAQSM. Primary Care and Sports Medicine [facility]  Adjunct Professor of [place] [employer] [facility] of Medicine

## 2019-04-07 NOTE — Assessment & Plan Note (Signed)
Elevated, increasing lisinopril to a full tab daily. Return in 2 weeks for a nurse visit blood pressure check

## 2019-04-07 NOTE — Assessment & Plan Note (Signed)
Routine annual physical as above. Checking some labs, he is up-to-date on all of his vaccines.

## 2019-04-08 ENCOUNTER — Encounter: Payer: Self-pay | Admitting: Sports Medicine

## 2019-04-08 DIAGNOSIS — E782 Mixed hyperlipidemia: Secondary | ICD-10-CM

## 2019-04-08 DIAGNOSIS — I10 Essential (primary) hypertension: Secondary | ICD-10-CM

## 2019-04-10 LAB — CBC
HCT: 47.2 % (ref 38.5–50.0)
Hemoglobin: 16 g/dL (ref 13.2–17.1)
MCH: 32.3 pg (ref 27.0–33.0)
MCHC: 33.9 g/dL (ref 32.0–36.0)
MCV: 95.2 fL (ref 80.0–100.0)
MPV: 9.4 fL (ref 7.5–12.5)
Platelets: 263 10*3/uL (ref 140–400)
RBC: 4.96 10*6/uL (ref 4.20–5.80)
RDW: 12.6 % (ref 11.0–15.0)
WBC: 6.7 10*3/uL (ref 3.8–10.8)

## 2019-04-10 LAB — COMPLETE METABOLIC PANEL WITH GFR
AG Ratio: 2.2 (calc) (ref 1.0–2.5)
ALT: 38 U/L (ref 9–46)
AST: 30 U/L (ref 10–35)
Albumin: 4.7 g/dL (ref 3.6–5.1)
Alkaline phosphatase (APISO): 75 U/L (ref 35–144)
BUN: 19 mg/dL (ref 7–25)
CO2: 21 mmol/L (ref 20–32)
Calcium: 10 mg/dL (ref 8.6–10.3)
Chloride: 100 mmol/L (ref 98–110)
Creat: 0.98 mg/dL (ref 0.70–1.25)
GFR, Est African American: 97 mL/min/{1.73_m2} (ref >=60)
GFR, Est Non African American: 83 mL/min/{1.73_m2} (ref >=60)
Globulin: 2.1 g/dL (calc) (ref 1.9–3.7)
Glucose, Bld: 104 mg/dL — ABNORMAL HIGH (ref 65–99)
Potassium: 4.4 mmol/L (ref 3.5–5.3)
Sodium: 136 mmol/L (ref 135–146)
Total Bilirubin: 0.7 mg/dL (ref 0.2–1.2)
Total Protein: 6.8 g/dL (ref 6.1–8.1)

## 2019-04-10 LAB — PSA, TOTAL AND FREE
PSA, % Free: 14 % (calc) — ABNORMAL LOW (ref >25)
PSA, Free: 0.1 ng/mL
PSA, Total: 0.7 ng/mL (ref <=4.0)

## 2019-04-10 LAB — LIPID PANEL W/REFLEX DIRECT LDL
Cholesterol: 198 mg/dL (ref <200)
HDL: 71 mg/dL (ref >=40)
LDL Cholesterol (Calc): 84 mg/dL (calc)
Non-HDL Cholesterol (Calc): 127 mg/dL (calc) (ref <130)
Total CHOL/HDL Ratio: 2.8 (calc) (ref <5.0)
Triglycerides: 359 mg/dL — ABNORMAL HIGH (ref <150)

## 2019-04-17 MED ORDER — LISINOPRIL 20 MG PO TABS: 20.0000 mg | ORAL_TABLET | Freq: Every day | ORAL | 1 refills | Status: DC

## 2019-04-17 MED ORDER — FENOFIBRATE 160 MG PO TABS: 160.0000 mg | ORAL_TABLET | Freq: Every day | ORAL | 3 refills | Status: DC

## 2019-04-17 NOTE — Assessment & Plan Note (Signed)
Triglycerides continue to be elevated, continue rosuvastatin, adding high-dose fenofibrate. Recheck fasting lipids in 3 months.

## 2019-04-18 ENCOUNTER — Encounter: Payer: Self-pay | Admitting: Sports Medicine

## 2019-04-20 ENCOUNTER — Other Ambulatory Visit: Payer: Self-pay

## 2019-04-20 ENCOUNTER — Ambulatory Visit (INDEPENDENT_AMBULATORY_CARE_PROVIDER_SITE_OTHER): Payer: 59 | Admitting: Osteopathic Medicine

## 2019-04-20 VITALS — BP 143/91 | HR 90

## 2019-04-20 DIAGNOSIS — I10 Essential (primary) hypertension: Secondary | ICD-10-CM

## 2019-04-20 NOTE — Progress Notes (Signed)
Pt here for bp check after starting 20mg  of Lisinopril.  His two readings in the office were still elevated at 153/83 and 143/91.  His home reading this morning was 128/82.  Pt said he will upload and send pictures of his home readings for verification.

## 2019-04-24 ENCOUNTER — Ambulatory Visit: Payer: 59

## 2019-04-25 ENCOUNTER — Encounter: Payer: Self-pay | Admitting: Sports Medicine

## 2019-04-25 DIAGNOSIS — I10 Essential (primary) hypertension: Secondary | ICD-10-CM

## 2019-04-28 MED ORDER — LISINOPRIL 30 MG PO TABS: 30.0000 mg | ORAL_TABLET | Freq: Every day | ORAL | 3 refills | Status: DC

## 2019-05-24 ENCOUNTER — Encounter: Payer: Self-pay | Admitting: Sports Medicine

## 2019-07-03 ENCOUNTER — Telehealth: Payer: Self-pay | Admitting: Sports Medicine

## 2019-07-03 NOTE — Telephone Encounter (Signed)
Patient called and reports that he just found out a co-worker tested positive over the weekend for Covid and was offered a virtual with PCP and declined. He will check back if he wants a virtual appointment later. He is going to call around about testing sites for COVID-19.

## 2019-07-03 NOTE — Telephone Encounter (Addendum)
Patient called over the weekend to the after hours nurse and was having runny nose, fever, sore throat, and cough. He reports that he is doing much better. He was offered an appointment and he declined.He was advised if his symptoms became worse to seek medical care such as urgent care or emergency room and he voices understanding. No other concerns at this time.

## 2019-07-04 ENCOUNTER — Ambulatory Visit: Payer: 59 | Attending: Internal Medicine

## 2019-07-04 DIAGNOSIS — Z20822 Contact with and (suspected) exposure to covid-19: Secondary | ICD-10-CM

## 2019-07-05 ENCOUNTER — Ambulatory Visit (INDEPENDENT_AMBULATORY_CARE_PROVIDER_SITE_OTHER): Payer: 59 | Admitting: Sports Medicine

## 2019-07-05 ENCOUNTER — Encounter: Payer: Self-pay | Admitting: Sports Medicine

## 2019-07-05 DIAGNOSIS — I10 Essential (primary) hypertension: Secondary | ICD-10-CM

## 2019-07-05 LAB — NOVEL CORONAVIRUS, NAA: SARS-CoV-2, NAA: DETECTED — AB

## 2019-07-05 MED ORDER — LISINOPRIL-HYDROCHLOROTHIAZIDE 20-12.5 MG PO TABS: 1.0000 | ORAL_TABLET | Freq: Every day | ORAL | 3 refills | Status: DC

## 2019-07-05 NOTE — Assessment & Plan Note (Addendum)
Blood pressure is continuing to be elevated, switching from lisinopril 30 to lisinopril/HCTZ 20/12.5. Certainly if it is not well controlled we will increase to lisinopril/HCTZ 20/25. We can revisit this in 2 weeks. Of note currently undergoing Covid testing.  Feeling okay in terms of symptoms.

## 2019-07-05 NOTE — Progress Notes (Signed)
Virtual Visit via WebEx/MyChart   I connected with  John Benton  on 07/05/19 via WebEx/MyChart/Doximity Video and verified that I am speaking with the correct person using two identifiers.   I discussed the limitations, risks, security and privacy concerns of performing an evaluation and management service by WebEx/MyChart/Doximity Video, including the higher likelihood of inaccurate diagnosis and treatment, and the availability of in person appointments.  We also discussed the likely need of an additional face to face encounter for complete and high quality delivery of care.  I also discussed with the patient that there may be a patient responsible charge related to this service. The patient expressed understanding and wishes to proceed.  Provider location is either at home or medical facility. Patient location is at their home, different from provider location. People involved in care of the patient during this telehealth encounter were myself, my nurse/medical assistant, and my front office/scheduling team member.  Subjective:    CC: Blood pressure problems  HPI: Noted elevated blood pressure, no headaches, visual changes, chest pain.  I reviewed the past medical history, family history, social history, surgical history, and allergies today and no changes were needed.  Please see the problem list section below in epic for further details.  Past Medical History: Past Medical History:  Diagnosis Date  . Arthritis   . Cancer ([place])    SKIN CANCERS REMOVED  . GERD (gastroesophageal reflux disease)   . Hyperlipemia   . Hypertension    NEW DIAGNOSIS 05/28/14 -PT PUT ON METOPROLOL AND IS TO FOLLOW UP WITH HIS MEDCIAL DOCTOR TODAY  06/26/14  . No pertinent past medical history   . Wears glasses    Past Surgical History: Past Surgical History:  Procedure Laterality Date  . biceps tenodesis- left shoulder  04/16/14   [facility]  . CERVICAL FUSION  2006  .  COLONOSCOPY    . 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  Marland Kitchen FASCIECTOMY Left 05/16/2015   Procedure: FASCIECTOMY RECURRENCE LEFT SMALL FINGER WITH ;  Surgeon: Daryll Brod, MD;  Location: [place];  Service: Orthopedics;  Laterality: Left;  . FASCIOTOMY Left 05/17/2013   Procedure: FASCIECTOMY LEFT SMALL, RING, MIDDLE FINGERS;  Surgeon: Wynonia Sours, MD;  Location: [place];  Service: Orthopedics;  Laterality: Left;  . INGUINAL HERNIA REPAIR  2002   lt  . SHOULDER ARTHROSCOPY     right x2  . SHOULDER ARTHROSCOPY W/ ROTATOR CUFF REPAIR  2010   left  . SKIN FULL THICKNESS GRAFT Left 05/16/2015   Procedure: HYPOTHENAR SKIN GRAFT ;  Surgeon: Daryll Brod, MD;  Location: [place];  Service: Orthopedics;  Laterality: Left;  . SPERMATOCELECTOMY  2010  . TENOSYNOVECTOMY Left 05/04/2017   Procedure: LEFT WRIST TENOSYNOVECTOMY EXTENSOR CARPI ULNARIS;  Surgeon: Daryll Brod, MD;  Location: [place];  Service: Orthopedics;  Laterality: Left;  . TOTAL HIP ARTHROPLASTY Left 07/03/2014   Procedure: LEFT TOTAL HIP ARTHROPLASTY ANTERIOR APPROACH;  Surgeon: Mauri Pole, MD;  Location: [facility];  Service: Orthopedics;  Laterality: Left;  . UMBILICAL HERNIA REPAIR N/A 05/17/2013   Procedure: HERNIA REPAIR UMBILICAL ADULT;  Surgeon: Merrie Roof, MD;  Location: [place];  Service: General;  Laterality: N/A;  .  WRIST ARTHROSCOPY WITH DEBRIDEMENT Left 05/04/2017   Procedure: WRIST ARTHROSCOPY WITH DEBRIDEMENT OF HAMMATE;  Surgeon: Daryll Brod, MD;  Location: [place];  Service: Orthopedics;  Laterality: Left;   Social History: Social History   Socioeconomic History  . Marital status: Married     Spouse name: Not on file  . Number of children: Not on file  . Years of education: Not on file  . Highest education level: Not on file  Occupational History  . Not on file  Tobacco Use  . Smoking status: Former Smoker    Packs/day: 1.00    Years: 20.00    Pack years: 20.00    Types: Cigarettes  . Smokeless tobacco: Never Used  Substance and Sexual Activity  . Alcohol use: Yes    Comment: social  . Drug use: No  . Sexual activity: Not on file  Other Topics Concern  . Not on file  Social History Narrative  . Not on file   Social Determinants of Health   Financial Resource Strain:   . Difficulty of Paying Living Expenses: Not on file  Food Insecurity:   . Worried About Charity fundraiser in the Last Year: Not on file  . Ran Out of Food in the Last Year: Not on file  Transportation Needs:   . Lack of Transportation (Medical): Not on file  . Lack of Transportation (Non-Medical): Not on file  Physical Activity:   . Days of Exercise per Week: Not on file  . Minutes of Exercise per Session: Not on file  Stress:   . Feeling of Stress : Not on file  Social Connections:   . Frequency of Communication with Friends and Family: Not on file  . Frequency of Social Gatherings with Friends and Family: Not on file  . Attends Religious Services: Not on file  . Active Member of Clubs or Organizations: Not on file  . Attends Archivist Meetings: Not on file  . Marital Status: Not on file   Family History: Family History  Problem Relation Age of Onset  . Kidney disease Mother   . Cancer Sister        Breast  . Cancer Maternal Grandmother        Breast   Allergies: Allergies  Allergen Reactions  . Penicillins Anaphylaxis  . Keflex [Cephalexin] Swelling   Medications: See med rec.  Review of Systems: No fevers, chills, night sweats, weight loss, chest pain, or shortness of breath.   Objective:    General: Speaking full sentences, no audible heavy breathing.   Sounds alert and appropriately interactive.  Appears well.  Face symmetric.  Extraocular movements intact.  Pupils equal and round.  No nasal flaring or accessory muscle use visualized.  No other physical exam performed due to the non-physical nature of this visit.  Impression and Recommendations:    No problem-specific Assessment & Plan notes found for this encounter.   I discussed the above assessment and treatment plan with the patient. The patient was provided an opportunity to ask questions and all were answered. The patient agreed with the plan and demonstrated an understanding of the instructions.   The patient was advised to call back or seek an in-person evaluation if the symptoms worsen or if the condition fails to improve as anticipated.   I provided 25 minutes of non-face-to-face time during this encounter, 15 minutes of additional time was needed to gather information, review chart, records, communicate/coordinate with staff remotely, troubleshooting  the multiple errors that we get every time when trying to do video calls through the electronic medical record, WebEx, and Doximity, restart the encounter multiple times due to instability of the software, as well as complete documentation.   ___________________________________________ Gwen Her. Dianah Field, M.D., ABFM., CAQSM. Primary Care and Sports Medicine [facility]  Adjunct Professor of [place] [employer] [facility] of Medicine

## 2019-07-06 ENCOUNTER — Telehealth: Payer: Self-pay | Admitting: *Deleted

## 2019-07-06 NOTE — Telephone Encounter (Signed)
Pt returned call to discuss covid 19 test results. He viewed his results on MyChart and understands that he is positive. Pt advised to quarantine for 10 to 14 days. May come out of quarantine if no fever without taking antipyretics (fever medicine) and symptoms have decreased at 10 days. Temperature should be within normal limits. Reviewed signs and symptoms of covid.  You should treat symptoms with OTC medications.  He has had fever, GI distress, runny nose and headache. Fever has gone and symptoms are getting better.  You should also notify your PCP.  He spoke with is provider today and received recommendations for the virus. Get fresh air, hydrated and get rest. Should be able to go outside your house, alone. Call 911 for respiratory distress (struggling to breath).  Remember to wear your mask, stay at home, social distant and clean the hard surfaces in your house. And all family members if they have not been tested, should be tested. Pt voiced understanding.

## 2019-08-31 ENCOUNTER — Ambulatory Visit (INDEPENDENT_AMBULATORY_CARE_PROVIDER_SITE_OTHER): Payer: 59 | Admitting: Sports Medicine

## 2019-08-31 ENCOUNTER — Other Ambulatory Visit: Payer: Self-pay | Admitting: Sports Medicine

## 2019-08-31 ENCOUNTER — Encounter: Payer: Self-pay | Admitting: Sports Medicine

## 2019-08-31 DIAGNOSIS — I10 Essential (primary) hypertension: Secondary | ICD-10-CM | POA: Diagnosis not present

## 2019-08-31 DIAGNOSIS — E785 Hyperlipidemia, unspecified: Secondary | ICD-10-CM

## 2019-08-31 MED ORDER — LISINOPRIL-HYDROCHLOROTHIAZIDE 20-12.5 MG PO TABS: 1.0000 | ORAL_TABLET | Freq: Every day | ORAL | 3 refills | Status: DC

## 2019-08-31 NOTE — Assessment & Plan Note (Signed)
At the last visit we switched John Benton to lisinopril/hydrochlorothiazide relatively low dose. He is doing very well, blood pressures run from 100/70 to 130/80. He thinks the average closer to 120/70. This is absolutely adequate, continue current medications, he will be coming back sometime this week or next week for labs. Unfortunately Zoravar is moving to [facility] and needs a doctor in [place].

## 2019-08-31 NOTE — Progress Notes (Signed)
   Virtual Visit via WebEx/MyChart   I connected with  John Benton  on 08/31/19 via WebEx/MyChart/Doximity Video and verified that I am speaking with the correct person using two identifiers.   I discussed the limitations, risks, security and privacy concerns of performing an evaluation and management service by WebEx/MyChart/Doximity Video, including the higher likelihood of inaccurate diagnosis and treatment, and the availability of in person appointments.  We also discussed the likely need of an additional face to face encounter for complete and high quality delivery of care.  I also discussed with the patient that there may be a patient responsible charge related to this service. The patient expressed understanding and wishes to proceed.  Provider location is either at home or medical facility. Patient location is at their home, different from provider location. People involved in care of the patient during this telehealth encounter were myself, my nurse/medical assistant, and my front office/scheduling team member.  Review of Systems: No fevers, chills, night sweats, weight loss, chest pain, or shortness of breath.   Objective Findings:    General: Speaking full sentences, no audible heavy breathing.  Sounds alert and appropriately interactive.  Appears well.  Face symmetric.  Extraocular movements intact.  Pupils equal and round.  No nasal flaring or accessory muscle use visualized.  Independent interpretation of tests performed by another provider:   None.  Impression and Recommendations:    Essential hypertension, benign At the last visit we switched John Benton to lisinopril/hydrochlorothiazide relatively low dose. He is doing very well, blood pressures run from 100/70 to 130/80. He thinks the average closer to 120/70. This is absolutely adequate, continue current medications, he will be coming back sometime this week or next week for labs. Unfortunately John Benton is moving to [facility]  and needs a doctor in [place].   I discussed the above assessment and treatment plan with the patient. The patient was provided an opportunity to ask questions and all were answered. The patient agreed with the plan and demonstrated an understanding of the instructions.   The patient was advised to call back or seek an in-person evaluation if the symptoms worsen or if the condition fails to improve as anticipated.   I provided 30 minutes of face to face and non-face-to-face time during this encounter date, time was needed to gather information, review chart, records, communicate/coordinate with staff remotely, as well as complete documentation.   ___________________________________________ Gwen Her. Dianah Field, M.D., ABFM., CAQSM. Primary Care and [place] Instructor of [place] [employer] [facility] [employer] of Medicine

## 2019-09-05 ENCOUNTER — Other Ambulatory Visit: Payer: Self-pay

## 2019-09-05 DIAGNOSIS — E782 Mixed hyperlipidemia: Secondary | ICD-10-CM

## 2019-10-06 ENCOUNTER — Other Ambulatory Visit: Payer: Self-pay | Admitting: Sports Medicine

## 2019-10-06 DIAGNOSIS — N529 Male erectile dysfunction, unspecified: Secondary | ICD-10-CM

## 2019-11-30 ENCOUNTER — Other Ambulatory Visit: Payer: Self-pay | Admitting: Sports Medicine

## 2019-11-30 DIAGNOSIS — E785 Hyperlipidemia, unspecified: Secondary | ICD-10-CM

## 2019-12-04 ENCOUNTER — Other Ambulatory Visit: Payer: Self-pay

## 2019-12-04 ENCOUNTER — Encounter: Payer: Self-pay | Admitting: Osteopathic Medicine

## 2019-12-04 DIAGNOSIS — E785 Hyperlipidemia, unspecified: Secondary | ICD-10-CM

## 2019-12-04 MED ORDER — ROSUVASTATIN CALCIUM 40 MG PO TABS: 40.0000 mg | ORAL_TABLET | Freq: Every day | ORAL | 0 refills | Status: DC

## 2019-12-05 IMAGING — MR MR LUMBAR SPINE W/O CM
5 series · 42 of 48 positions shown · non-contrast
Comparison: 09/06/2017

CLINICAL DATA: Severe left leg pain increasing for 6 months.

EXAM:
MRI LUMBAR SPINE WITHOUT CONTRAST
TECHNIQUE: Multiplanar, multisequence MR imaging of the lumbar spine was
performed. No intravenous contrast was administered.

[Series 3: T2 post-contrast · sagittal · 4.0mm · 0.88mm/px · 6 of 13 slices shown]
[im 1/13]
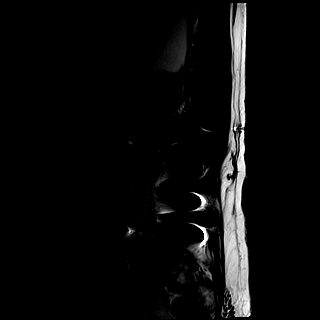
[im 3/13]
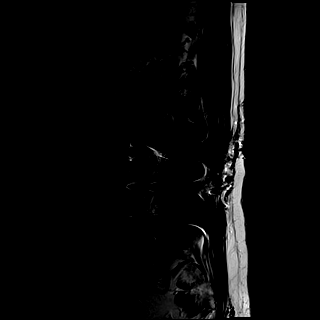
[im 5/13]
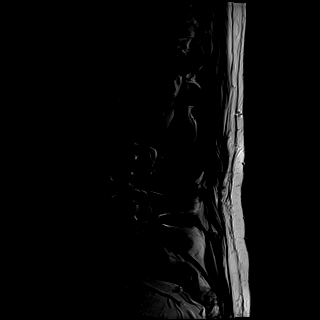
[im 8/13]
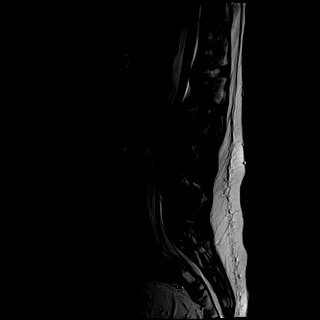
[im 10/13]
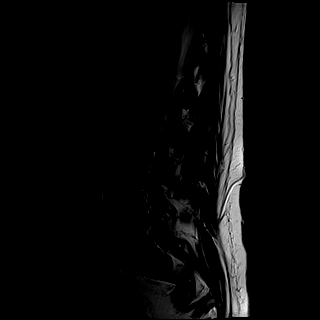
[im 13/13]
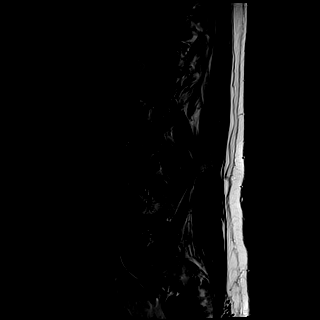

[Series 4: T1 · sagittal · 4.0mm · 0.88mm/px · 6 of 13 slices shown (1 of 2)]
[im 1/13]
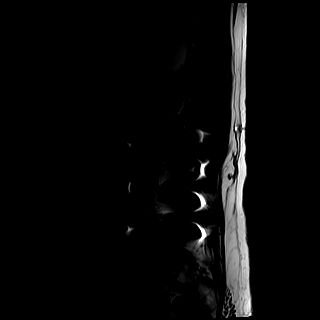
[im 3/13]
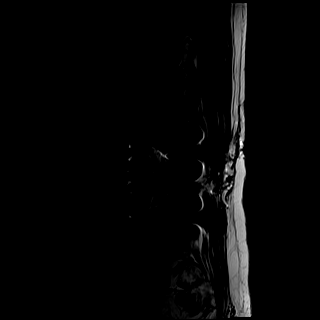
[im 5/13]
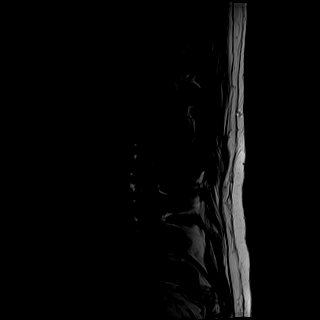
[im 8/13]
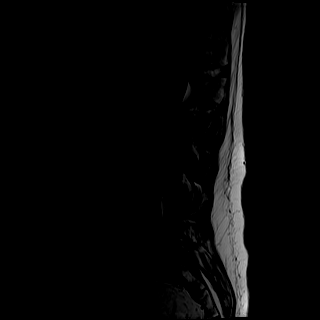
[im 10/13]
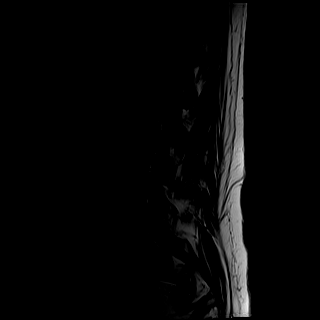
[im 13/13]
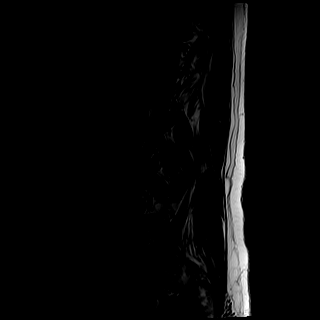

[Series 5: STIR · sagittal · 4.0mm · 0.55mm/px · 6 of 13 slices shown]
[im 1/13]
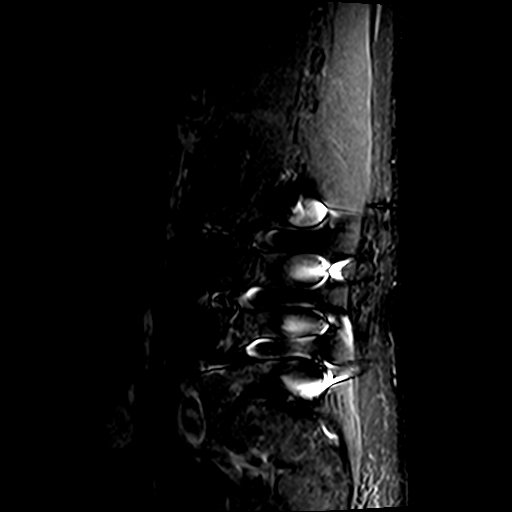
[im 3/13]
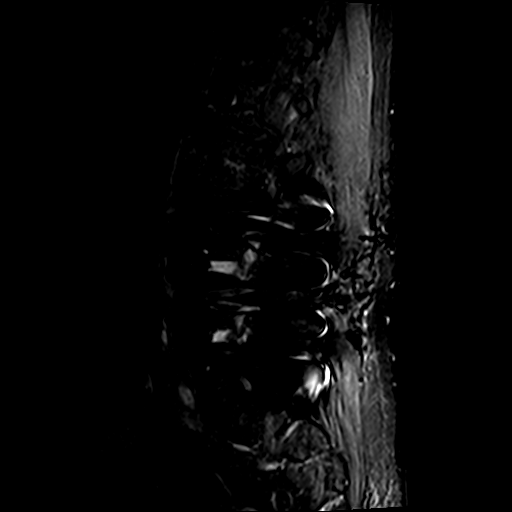
[im 5/13]
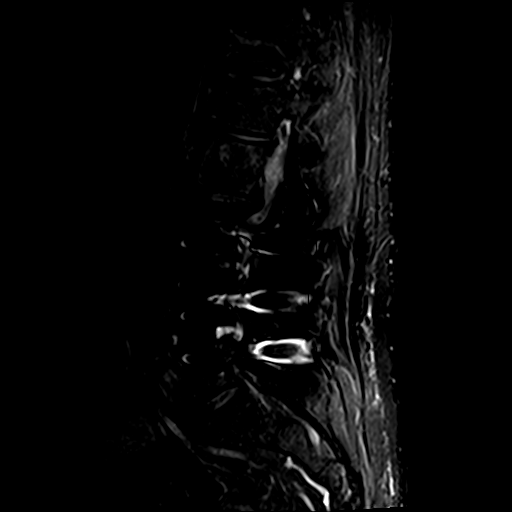
[im 8/13]
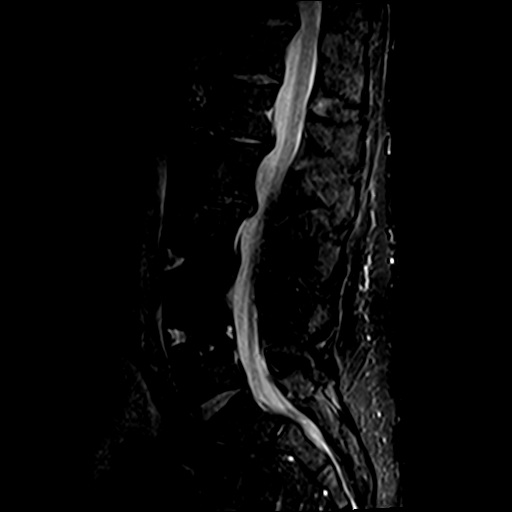
[im 10/13]
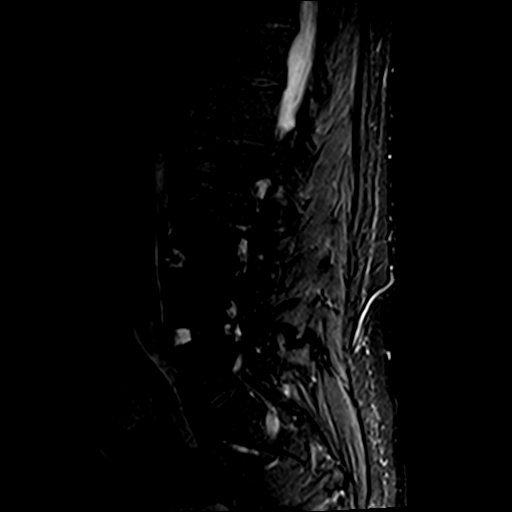
[im 13/13]
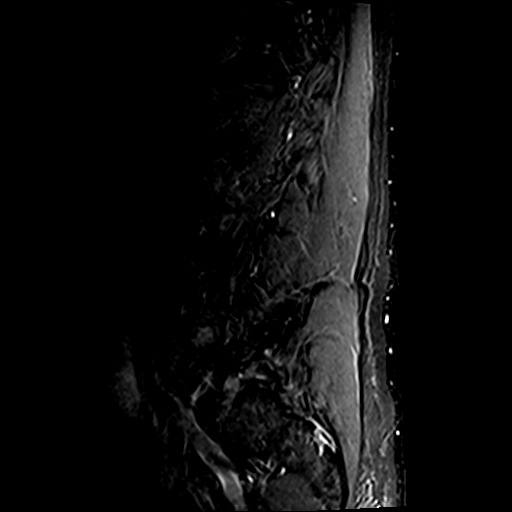

[Series 6: T1 · axial · 4.0mm · 0.74mm/px · z∈[-110,+121]mm · 9 of 34 slices shown (2 of 2)]
[im 1/34]
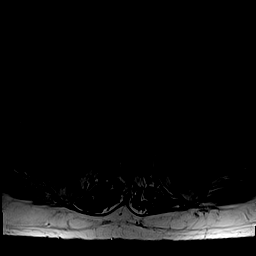
[im 5/34]
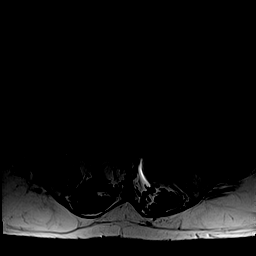
[im 10/34]
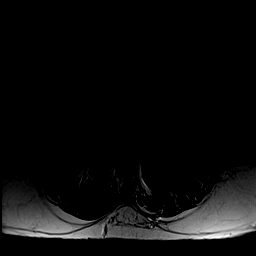
[im 15/34]
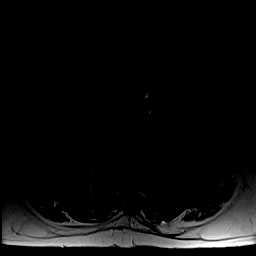
[im 17/34]
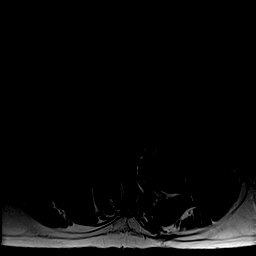
[im 19/34]
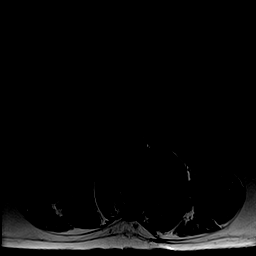
[im 24/34]
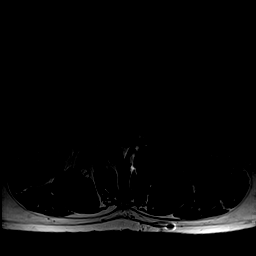
[im 29/34]
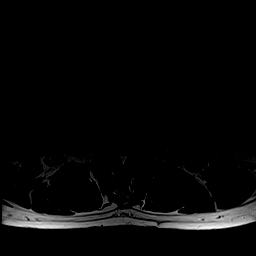
[im 34/34]
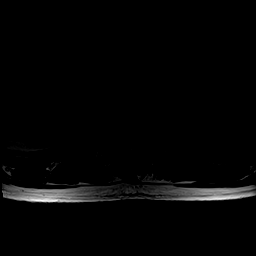

[Series 7: T2 · axial · 4.0mm · 0.74mm/px · z∈[-110,+121]mm · 15 of 34 slices shown]
[im 1/34]
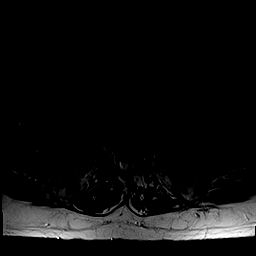
[im 3/34]
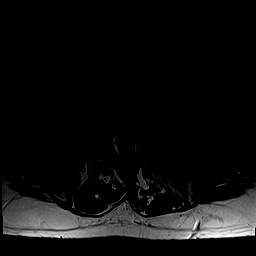
[im 5/34]
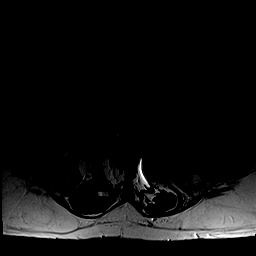
[im 8/34]
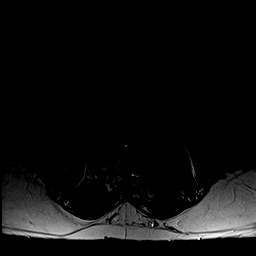
[im 10/34]
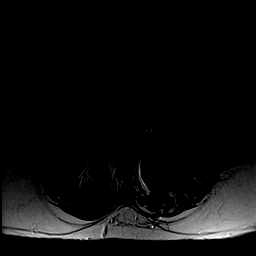
[im 12/34]
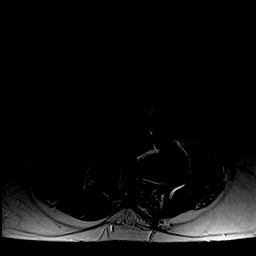
[im 15/34]
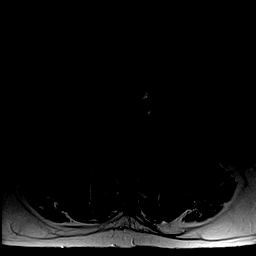
[im 17/34]
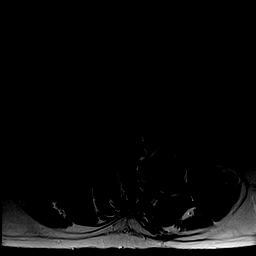
[im 19/34]
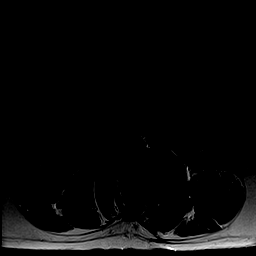
[im 22/34]
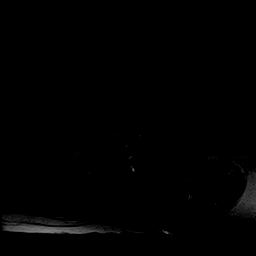
[im 24/34]
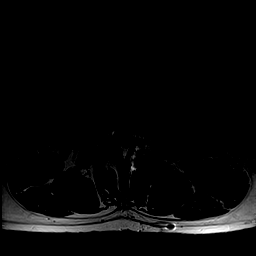
[im 26/34]
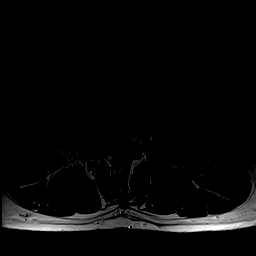
[im 29/34]
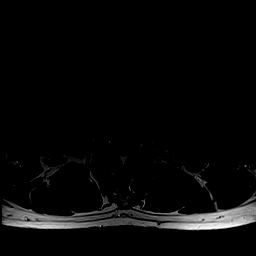
[im 31/34]
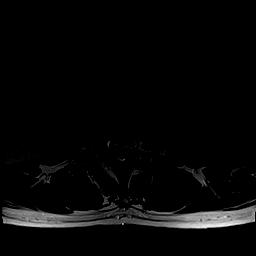
[im 34/34]
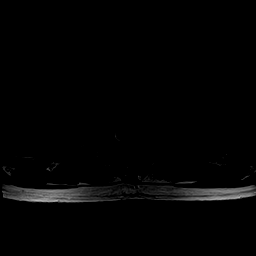

[42 of 48 positions shown; findings below may reference images not displayed]

FINDINGS: Segmentation:  Standard.

Alignment:  Physiologic.

Vertebrae: No fracture, evidence of discitis, or aggressive bone
lesion. T1 and T2 hyperintense L2 vertebral body bone lesion
consistent with a hemangioma.

Conus medullaris and cauda equina: Conus extends to the L1 level.
Conus and cauda equina appear normal.

Paraspinal and other soft tissues: No acute paraspinal abnormality.

Disc levels:

Disc spaces: Posterior lumbar interbody fusion from L3 through L5
and left anterior interbody fusion. Degenerative disease with disc
height loss at T12-L1, L1-2, L 2-3.

T11-12: Mild broad-based disc bulge. Mild bilateral facet
arthropathy. No foraminal or central canal stenosis.

T12-L1: No significant disc bulge. No evidence of neural foraminal
stenosis. No central canal stenosis. Mild bilateral facet
arthropathy.

L1-L2: Broad-based disc osteophyte complex with a tiny right
paracentral disc protrusion. Mild bilateral facet arthropathy. No
evidence of neural foraminal stenosis. No central canal stenosis.

L2-L3: Broad-based disc bulge with a small central disc protrusion.
Mild bilateral facet arthropathy. Moderate spinal stenosis. No
evidence of neural foraminal stenosis.

L3-L4: Interbody fusion. No evidence of neural foraminal stenosis.
No central canal stenosis.

L4-L5: Interbody fusion. No evidence of neural foraminal stenosis.
No central canal stenosis.

L5-S1: Mild broad-based disc bulge. Severe bilateral facet
arthropathy. Mild right and mild-moderate left foraminal stenosis.
No evidence of neural foraminal stenosis. No central canal stenosis.
IMPRESSION: 1. Interval posterior and left anterior lumbar interbody fusion from
L3 through L5 without recurrent foraminal or central canal stenosis.
2. At L2-3 there is a broad-based disc bulge with a small central
disc protrusion. Mild bilateral facet arthropathy. Moderate spinal
stenosis.
3. At L5-S1 there is a mild broad-based disc bulge. Severe bilateral
facet arthropathy. Mild right and mild-moderate left foraminal
stenosis.

## 2020-01-29 DIAGNOSIS — E782 Mixed hyperlipidemia: Secondary | ICD-10-CM

## 2020-01-29 DIAGNOSIS — E785 Hyperlipidemia, unspecified: Secondary | ICD-10-CM

## 2020-01-29 DIAGNOSIS — I10 Essential (primary) hypertension: Secondary | ICD-10-CM

## 2020-01-29 NOTE — Telephone Encounter (Signed)
Yes, that is fine, it sounds like he is switching PCPs to someone in [place] where he lives.

## 2020-01-30 MED ORDER — ROSUVASTATIN CALCIUM 40 MG PO TABS: 40.0000 mg | ORAL_TABLET | Freq: Every day | ORAL | 0 refills | Status: DC

## 2020-01-30 MED ORDER — LISINOPRIL-HYDROCHLOROTHIAZIDE 20-12.5 MG PO TABS: 1.0000 | ORAL_TABLET | Freq: Every day | ORAL | 0 refills | Status: DC

## 2020-01-30 MED ORDER — FENOFIBRATE 160 MG PO TABS: 160.0000 mg | ORAL_TABLET | Freq: Every day | ORAL | 0 refills | Status: DC

## 2020-04-21 ENCOUNTER — Other Ambulatory Visit: Payer: Self-pay | Admitting: Sports Medicine

## 2020-04-21 DIAGNOSIS — E782 Mixed hyperlipidemia: Secondary | ICD-10-CM

## 2020-04-21 DIAGNOSIS — E785 Hyperlipidemia, unspecified: Secondary | ICD-10-CM

## 2020-04-21 DIAGNOSIS — I10 Essential (primary) hypertension: Secondary | ICD-10-CM
